# Patient Record
Sex: Female | Born: 1955 | Race: White | Hispanic: No | Marital: Married | Smoking: Never smoker
Health system: Southern US, Community
[De-identification: ages and names within clinical notes are randomized; demographics above are authoritative.]

## PROBLEM LIST (undated history)

## (undated) DIAGNOSIS — E611 Iron deficiency: Secondary | ICD-10-CM

## (undated) DIAGNOSIS — I10 Essential (primary) hypertension: Secondary | ICD-10-CM

## (undated) DIAGNOSIS — N951 Menopausal and female climacteric states: Secondary | ICD-10-CM

## (undated) DIAGNOSIS — N952 Postmenopausal atrophic vaginitis: Secondary | ICD-10-CM

## (undated) DIAGNOSIS — Z01419 Encounter for gynecological examination (general) (routine) without abnormal findings: Secondary | ICD-10-CM

## (undated) DIAGNOSIS — D649 Anemia, unspecified: Secondary | ICD-10-CM

## (undated) DIAGNOSIS — K635 Polyp of colon: Secondary | ICD-10-CM

## (undated) DIAGNOSIS — Z9289 Personal history of other medical treatment: Secondary | ICD-10-CM

## (undated) DIAGNOSIS — E785 Hyperlipidemia, unspecified: Secondary | ICD-10-CM

## (undated) DIAGNOSIS — C4491 Basal cell carcinoma of skin, unspecified: Secondary | ICD-10-CM

## (undated) HISTORY — PX: OTHER SURGICAL HISTORY: SHX169

## (undated) HISTORY — DX: Postmenopausal atrophic vaginitis: N95.2

## (undated) HISTORY — DX: Menopausal and female climacteric states: N95.1

## (undated) HISTORY — PX: BREAST SURGERY: SHX581

## (undated) HISTORY — DX: Polyp of colon: K63.5

## (undated) HISTORY — DX: Encounter for gynecological examination (general) (routine) without abnormal findings: Z01.419

## (undated) HISTORY — DX: Essential (primary) hypertension: I10

## (undated) HISTORY — DX: Iron deficiency: E61.1

## (undated) HISTORY — DX: Hyperlipidemia, unspecified: E78.5

## (undated) HISTORY — DX: Basal cell carcinoma of skin, unspecified: C44.91

## (undated) HISTORY — DX: Personal history of other medical treatment: Z92.89

## (undated) HISTORY — DX: Anemia, unspecified: D64.9

---

## 2000-03-30 ENCOUNTER — Other Ambulatory Visit: Admission: RE | Admit: 2000-03-30 | Discharge: 2000-03-30 | Payer: Self-pay | Admitting: Gynecology

## 2000-04-02 ENCOUNTER — Other Ambulatory Visit: Admission: RE | Admit: 2000-04-02 | Discharge: 2000-04-02 | Payer: Self-pay | Admitting: Gynecology

## 2001-04-02 ENCOUNTER — Other Ambulatory Visit: Admission: RE | Admit: 2001-04-02 | Discharge: 2001-04-02 | Payer: Self-pay | Admitting: Internal Medicine

## 2002-06-13 ENCOUNTER — Other Ambulatory Visit: Admission: RE | Admit: 2002-06-13 | Discharge: 2002-06-13 | Payer: Self-pay | Admitting: Gynecology

## 2003-07-03 ENCOUNTER — Other Ambulatory Visit: Admission: RE | Admit: 2003-07-03 | Discharge: 2003-07-03 | Payer: Self-pay | Admitting: Gynecology

## 2003-08-20 ENCOUNTER — Encounter: Admission: RE | Admit: 2003-08-20 | Discharge: 2003-11-18 | Payer: Self-pay | Admitting: Gynecology

## 2004-02-29 ENCOUNTER — Other Ambulatory Visit: Admission: RE | Admit: 2004-02-29 | Discharge: 2004-02-29 | Payer: Self-pay | Admitting: Gynecology

## 2004-07-08 ENCOUNTER — Other Ambulatory Visit: Admission: RE | Admit: 2004-07-08 | Discharge: 2004-07-08 | Payer: Self-pay | Admitting: Gynecology

## 2005-09-18 ENCOUNTER — Other Ambulatory Visit: Admission: RE | Admit: 2005-09-18 | Discharge: 2005-09-18 | Payer: Self-pay | Admitting: Gynecology

## 2005-12-04 ENCOUNTER — Encounter: Admission: RE | Admit: 2005-12-04 | Discharge: 2006-03-04 | Payer: Self-pay | Admitting: Gynecology

## 2006-09-20 ENCOUNTER — Other Ambulatory Visit: Admission: RE | Admit: 2006-09-20 | Discharge: 2006-09-20 | Payer: Self-pay | Admitting: Gynecology

## 2006-09-27 ENCOUNTER — Encounter: Payer: Self-pay | Admitting: Internal Medicine

## 2006-10-10 ENCOUNTER — Ambulatory Visit: Payer: Self-pay | Admitting: Internal Medicine

## 2007-10-02 ENCOUNTER — Other Ambulatory Visit: Admission: RE | Admit: 2007-10-02 | Discharge: 2007-10-02 | Payer: Self-pay | Admitting: Gynecology

## 2008-10-02 ENCOUNTER — Encounter: Payer: Self-pay | Admitting: Gynecology

## 2008-10-02 ENCOUNTER — Other Ambulatory Visit: Admission: RE | Admit: 2008-10-02 | Discharge: 2008-10-02 | Payer: Self-pay | Admitting: Gynecology

## 2008-10-02 ENCOUNTER — Ambulatory Visit: Payer: Self-pay | Admitting: Gynecology

## 2008-11-05 ENCOUNTER — Ambulatory Visit: Payer: Self-pay | Admitting: Gynecology

## 2009-06-20 DIAGNOSIS — E611 Iron deficiency: Secondary | ICD-10-CM

## 2009-06-20 HISTORY — DX: Iron deficiency: E61.1

## 2009-08-20 DIAGNOSIS — K635 Polyp of colon: Secondary | ICD-10-CM

## 2009-08-20 HISTORY — DX: Polyp of colon: K63.5

## 2009-10-04 ENCOUNTER — Ambulatory Visit: Payer: Self-pay | Admitting: Gynecology

## 2009-10-04 ENCOUNTER — Other Ambulatory Visit: Admission: RE | Admit: 2009-10-04 | Discharge: 2009-10-04 | Payer: Self-pay | Admitting: Gynecology

## 2009-10-05 ENCOUNTER — Ambulatory Visit: Payer: Self-pay | Admitting: Gynecology

## 2009-12-16 ENCOUNTER — Ambulatory Visit: Payer: Self-pay | Admitting: Gynecology

## 2010-03-28 ENCOUNTER — Ambulatory Visit (INDEPENDENT_AMBULATORY_CARE_PROVIDER_SITE_OTHER): Payer: BC Managed Care – PPO | Admitting: Physician Assistant

## 2010-03-28 DIAGNOSIS — J020 Streptococcal pharyngitis: Secondary | ICD-10-CM

## 2010-08-03 ENCOUNTER — Encounter: Payer: Self-pay | Admitting: Medical

## 2010-08-04 ENCOUNTER — Encounter: Payer: Self-pay | Admitting: Medical

## 2010-08-04 ENCOUNTER — Ambulatory Visit (INDEPENDENT_AMBULATORY_CARE_PROVIDER_SITE_OTHER): Payer: BC Managed Care – PPO | Admitting: Medical

## 2010-08-04 VITALS — BP 112/78 | HR 60 | Ht 68.0 in | Wt 146.0 lb

## 2010-08-04 DIAGNOSIS — E611 Iron deficiency: Secondary | ICD-10-CM

## 2010-08-04 DIAGNOSIS — E785 Hyperlipidemia, unspecified: Secondary | ICD-10-CM

## 2010-08-04 DIAGNOSIS — I10 Essential (primary) hypertension: Secondary | ICD-10-CM | POA: Insufficient documentation

## 2010-08-04 LAB — CBC WITH DIFFERENTIAL/PLATELET
Basophils Absolute: 0 10*3/uL (ref 0.0–0.1)
Basophils Relative: 0 % (ref 0–1)
Eosinophils Absolute: 0.1 10*3/uL (ref 0.0–0.7)
Eosinophils Relative: 2 % (ref 0–5)
HCT: 38.7 % (ref 36.0–46.0)
Hemoglobin: 12.4 g/dL (ref 12.0–15.0)
Lymphocytes Relative: 23 % (ref 12–46)
Lymphs Abs: 1.4 10*3/uL (ref 0.7–4.0)
MCH: 28.4 pg (ref 26.0–34.0)
MCHC: 32 g/dL (ref 30.0–36.0)
MCV: 88.6 fL (ref 78.0–100.0)
Monocytes Absolute: 0.7 10*3/uL (ref 0.1–1.0)
Monocytes Relative: 12 % (ref 3–12)
Neutro Abs: 3.7 10*3/uL (ref 1.7–7.7)
Neutrophils Relative %: 62 % (ref 43–77)
Platelets: 246 10*3/uL (ref 150–400)
RBC: 4.37 MIL/uL (ref 3.87–5.11)
RDW: 13.3 % (ref 11.5–15.5)
WBC: 5.9 10*3/uL (ref 4.0–10.5)

## 2010-08-04 LAB — FERRITIN: Ferritin: 15 ng/mL (ref 10–291)

## 2010-08-04 MED ORDER — MOEXIPRIL HCL 7.5 MG PO TABS: 7.5000 mg | ORAL_TABLET | Freq: Every day | ORAL | Status: DC

## 2010-08-04 MED ORDER — SIMVASTATIN 10 MG PO TABS: 10.0000 mg | ORAL_TABLET | Freq: Every day | ORAL | Status: DC

## 2010-08-04 NOTE — Progress Notes (Signed)
  Subjective:    Sherri Rangel is an 55 y.o. female who presents for follow-up of elevated blood pressure.  She is exercising and is adherent to a low-salt diet.  Blood pressure is well controlled at home. Cardiac symptoms: none. Patient denies: chest pain, dyspnea, fatigue, irregular heart beat and palpitations. Cardiovascular risk factors: dyslipidemia and hypertension. Use of agents associated with hypertension: none. History of target organ damage: none.   [name] is here for follow up of elevated cholesterol. Compliance with treatment has been excellent. The patient exercises daily. Patient denies muscle pain associated with her medications.  She is UTD on mammogram and pap smear, 7/11 through Dr. Fernandez/gynecology.  She has a colonoscopy last year due to iron deficiency.  No abnormality found on colonoscopy.  She brought prior records today for our review.   The following portions of the patient's history were reviewed and updated as appropriate: allergies, current medications, past family history, past medical history, past social history, past surgical history and problem list.  Past Medical History  Diagnosis Date  . Hypertension   . Hyperlipidemia   . BCC (basal cell carcinoma)     right eye  . Iron deficiency 5/11    Review of Systems Constitutional: denies recent fevers, chills, weight changes, anorexia Allergy: denies recent allergy problems, nasal congestion, sneezing Skin: denies foot lesions, rash, or other worrisome lesion ENT: denies cough, runny nose, sore throat, hoarseness Cardiology: denies chest pain, palpitations, edema, orthopnea, paroxysmal nocturnal dyspnea Respiratory: denies shortness of breath, dyspnea on exertion, wheezing Gastroenterology: no abdominal pain, nausea, vomiting, diarrhea, constipation, bowel change, blood in stool Musculoskeletal: denies arthralgias, myalgias, cramps Opthalmology: denies vision change, blurred vision, allergy  symptoms Urology: denies dysuria, frequency, urgency, blood in urine, stream changes Neurology: denies numbness, tingling, weakness, gait changes Psychology: denies depressed mood, changes in stress    Objective:   Filed Vitals:   08/04/10 0827  BP: 112/78  Pulse: 60    General Appearance:    Alert, no distress, white female   HEENT:    Normocephalic, without obvious abnormality, PERRLA, conjunctiva/corneas clear, EOM intact, fundi benign, TMs and external ears normal, nares patent, mucosa normal, pharynx normal appearing  Oral cavity:   Lips, mucosa, and tongue normal; teeth and gums normal  Neck:   Supple, no lymphadenopathy;  thyroid:  no    enlargement/tenderness/nodules; no carotid   bruit or JVD  Lungs:     Clear to auscultation bilaterally without wheezes, rales or       rhonchi; respirations unlabored   Heart:    Regular rate and rhythm, S1 and S2 normal, no murmur  Abdomen:     Soft, non-tender, non distended, normoactive bowel sounds,    no masses, no hepatosplenomegaly, no bruits  Extremities:   No clubbing, cyanosis or edema  Pulses:   2+ and symmetric all extremities  Skin:   Warm, dry, normal turgor, no foot lesions          Psych:   Normal mood, affect, hygiene and grooming.      Assessment:   Encounter Diagnoses  Name Primary?  . Essential hypertension, benign Yes  . Hyperlipidemia   . Iron deficiency       Plan:   HTN - well controlled on current medication.  Hyperlipidemia - c/t same meds, labs today fasting.  Iron deficiency - labs today to recheck.  C/t regular exercise, healthy diet, and call/return if problems.

## 2010-08-05 LAB — LIPID PANEL
Cholesterol: 162 mg/dL (ref 0–200)
Triglycerides: 53 mg/dL (ref <150)
VLDL: 11 mg/dL (ref 0–40)

## 2010-08-05 LAB — COMPREHENSIVE METABOLIC PANEL
CO2: 24 mEq/L (ref 19–32)
Calcium: 9.3 mg/dL (ref 8.4–10.5)
Chloride: 103 mEq/L (ref 96–112)
Glucose, Bld: 81 mg/dL (ref 70–99)
Sodium: 140 mEq/L (ref 135–145)
Total Bilirubin: 0.6 mg/dL (ref 0.3–1.2)
Total Protein: 6.8 g/dL (ref 6.0–8.3)

## 2010-08-05 LAB — IRON AND TIBC: Iron: 92 ug/dL (ref 42–145)

## 2010-08-10 ENCOUNTER — Telehealth: Payer: Self-pay | Admitting: *Deleted

## 2010-08-10 NOTE — Telephone Encounter (Addendum)
Message copied by Dorthula Perfect on Wed Aug 10, 2010 10:25 AM ------      Message from: Jac Canavan      Created: Tue Aug 09, 2010  9:04 PM       Labs all look fine - liver, kidney, lytes, iron, ferritin, blood counts, and cholesterol.  Ask if she is still taking iron, either prescription or OTC?  I know she had a colonoscopy, but did she ever have an upper endoscopy?    Notified pt of lab results.  Asked pt regarding OTC or prescription iron and pt stated "I only take a multivitamin that has iron in it but nothing else."  Asked pt regarding endoscopy and pt stated she did have that done also.  CM,LPN

## 2010-08-10 NOTE — Telephone Encounter (Signed)
Ok.  Then lets have her c/t same meds, regular exercise and healthy diet, return in the fall for flu shot.  Recheck in 6 mo.

## 2010-08-10 NOTE — Telephone Encounter (Signed)
Pt notified  To cont. Same meds with regular exercise and a healthy diet.  Scheduled a 6 month follow up on 01-09-2011 at 8:30 am.  CM, LPN

## 2010-08-17 ENCOUNTER — Ambulatory Visit (INDEPENDENT_AMBULATORY_CARE_PROVIDER_SITE_OTHER): Payer: BC Managed Care – PPO | Admitting: Family Medicine

## 2010-08-17 ENCOUNTER — Encounter: Payer: Self-pay | Admitting: Family Medicine

## 2010-08-17 VITALS — BP 110/74 | HR 80 | Temp 98.4°F | Ht 68.0 in | Wt 149.0 lb

## 2010-08-17 DIAGNOSIS — IMO0002 Reserved for concepts with insufficient information to code with codable children: Secondary | ICD-10-CM

## 2010-08-17 DIAGNOSIS — J309 Allergic rhinitis, unspecified: Secondary | ICD-10-CM

## 2010-08-17 MED ORDER — CEPHALEXIN 500 MG PO CAPS: 500.0000 mg | ORAL_CAPSULE | Freq: Three times a day (TID) | ORAL | Status: AC

## 2010-08-17 NOTE — Progress Notes (Signed)
SUBJECTIVE: Patient presents with complaint of infection in R index finger since the beginning of June.  Last week she could see a white spot--she poked it with a needle, and it drained pus.  Since then it is improved--no longer hot to the touch, less tender, smaller in size.  Presents today because it still just doesn't feel right.  Similar problem 4 years ago after a manicure.  She leaves Monday to go to the beach for 2 weeks, so presents for evaluation today.  Has some allergies flaring related to packing up books at school and exposure to dust.  Nasal congestion has gotten somewhat better, but now feels like it is in her chest. Some coughing, doesn't look at the phlegm that comes up.  Using Nyquil at night if coughing a lot at bedtime.  Past Medical History  Diagnosis Date  . Hypertension   . Hyperlipidemia   . Iron deficiency 06/2009    negative EGD and colonoscopy 08/2009  . BCC (basal cell carcinoma)     right eye, LLE  . Colon polyp 08/2009    WNL, internal hemorrhoid (Dr. Kinnie Scales); also had colonoscopy 2008 with Dr. Leone Payor    Past Surgical History  Procedure Date  . Skin tags   . Colonoscopy 2011    History   Social History  . Marital Status: Married    Spouse Name: N/A    Number of Children: N/A  . Years of Education: N/A   Occupational History  . music teacher at [employer]   Social History Main Topics  . Smoking status: Never Smoker   . Smokeless tobacco: Never Used  . Alcohol Use: 0.5 oz/week    1 drink(s) per week     4 drinks in a month  . Drug Use: No  . Sexually Active: Not on file     exercise - step aerobics, some weights.  Teaches elementary music.  Married.   Other Topics Concern  . Not on file   Social History Narrative  . No narrative on file    Family History  Problem Relation Age of Onset  . Cancer Mother 62    ovarian cancer  . Hypertension Mother   . Cancer Father     esophageal  . Alcohol abuse Father    . Hyperlipidemia Father   . Diabetes Maternal Aunt     Current outpatient prescriptions:Calcium Carbonate-Vitamin D (CALCIUM 600+D) 600-400 MG-UNIT per tablet, Take 1 tablet by mouth daily.  , Disp: , Rfl: ;  Krill Oil 300 MG CAPS, Take 1 capsule by mouth daily.  , Disp: , Rfl: ;  moexipril (UNIVASC) 7.5 MG tablet, Take 1 tablet (7.5 mg total) by mouth at bedtime., Disp: 90 tablet, Rfl: 3;  Multiple Vitamin (MULTIVITAMIN) tablet, Take 1 tablet by mouth daily.  , Disp: , Rfl:  simvastatin (ZOCOR) 10 MG tablet, Take 1 tablet (10 mg total) by mouth at bedtime., Disp: 90 tablet, Rfl: 3;  DISCONTD: Omega-3 Fatty Acids (OMEGA-3 FISH OIL PO), Take 1 capsule by mouth daily.  , Disp: , Rfl: ;  cephALEXin (KEFLEX) 500 MG capsule, Take 1 capsule (500 mg total) by mouth 3 (three) times daily., Disp: 30 capsule, Rfl: 0;  DISCONTD: calcium carbonate 200 MG capsule, Take 250 mg by mouth daily.  , Disp: , Rfl:   No Known Allergies  ROS:  Denies fevers, shortness of breath.  +URI symptoms--see HPI.  Denies rash, GI complaints or other concerns  PHYSICAL EXAM: BP  110/74  Pulse 80  Temp(Src) 98.4 F (36.9 C) (Oral)  Ht 5\' 8"  (1.727 m)  Wt 149 lb (67.586 kg)  BMI 22.66 kg/m2 Pleasant female, frequently clearing throat and coughing, in no distress HEENT:  PERRL, EOMI, conjunctiva clear.  TM's and EAC's normal.  Nasal mucosa mildly edematous with white drainage.  OP with some cobblestoning posteriorly.  Sinuses nontender Neck: no lymphadenopathy Heart:  Regular rate and rhythm without murmurs Lungs:  Clear bilaterally Extremities: no clubbing, cyanosis or edema.  Right 2nd finger, radial aspect of nail shows minimal inflammation, and an area where it recently drained.  No warmth, tenderness, just slightly swollen compared to other fingers.  No streaking, no drainage  ASSESSMENT/PLAN: 1. Paronychia  cephALEXin (KEFLEX) 500 MG capsule   R index finger  2. Allergic rhinitis, cause unspecified     Paronychia,  successfully treated at home with drainage. No evidence of continuing infection, but since patient is going out of town for the next 2 weeks, will give a rx for Keflex.  She will continue to do soaks 2-3 times/day.  If she develops increasing redness, warmth or pain, then will start (and complete) the antibiotics.  Allergies and chest congestion:  Recommended Claritin and mucinex.  F/u if symptoms persist/worsen

## 2010-08-17 NOTE — Patient Instructions (Addendum)
PARONYCHIA--continue to do soaks 2-3 times/day.  If you develop increasing redness, warmth or pain, then will start (and complete) the antibiotics.    Paronychia (Felon, Whitlow) Paronychia is an inflammatory reaction involving the folds of the skin surrounding the fingernail. This is commonly caused by an infection in the skin around a nail. The most common cause of paronychia is frequent wetting of the hands (as seen with bartenders, food servers, nurses or others who wet their hands). This makes the skin around the fingernail susceptible to infection by bacteria (germs) or fungus. Other predisposing factors are:  Aggressive manicuring.   Nail biting.   Thumbsucking.  The most common cause is a staphylococcal (a type of germ) infection, or a fungal (Candida) infection. When caused by a germ, it usually comes on suddenly with redness, swelling, pus and is often painful. It may get under the nail and form an abscess (collection of pus), or form an abscess around the nail. If the nail itself is infected with a fungus, the treatment is usually prolonged and may require oral medicine for up to one year. Your caregiver will determine the length of time treatment is required. The paronychia caused by bacteria (germs) may largely be avoided by not pulling on hangnails or picking at cuticles. When the infection occurs at the tips of the finger it is called felon. When the cause of paronychia is from the herpes simplex virus (HSV) it is called herpetic whitlow. TREATMENT  When an abscess is present treatment is often incision and drainage. This means that the abscess must be cut open so the pus can get out. When this is done, the following home care instructions should be followed.  HOME CARE INSTRUCTIONS  It is important to keep the affected fingers very dry. Rubber or plastic gloves over cotton gloves should be used whenever the hand must be placed in water.   Keep wound clean, dry and dressed as  suggested by your caregiver between warm soaks or warm compresses.   Soak in warm water for fifteen to twenty minutes three to four times per day for bacterial infections. Fungal infections are very difficult to treat, so often require treatment for long periods of time.   For bacterial (germ) infections take antibiotics (medicine which kill germs) as directed and finish the prescription, even if the problem appears to be solved before the medicine is gone.   Only take over-the-counter or prescription medicines for pain, discomfort, or fever as directed by your caregiver.  SEEK IMMEDIATE MEDICAL CARE IF :  There is redness, swelling, or increasing pain in the wound.   Pus is coming from wound.   An unexplained oral temperature above 101 develops.   You notice a foul smell coming from the wound or dressing.  Document Released: 08/02/2000 Document Re-Released: 05/05/2008 [facility] Patient Information 2011 [place], [place].   CLARITIN AND MUCINEX MAY HELP WITH YOUR ALLERGY SYMPTOMS AND CHEST CONGESTION

## 2010-09-15 ENCOUNTER — Encounter: Payer: Self-pay | Admitting: Gynecology

## 2010-10-10 ENCOUNTER — Encounter: Payer: Self-pay | Admitting: Gynecology

## 2010-10-10 ENCOUNTER — Other Ambulatory Visit (HOSPITAL_COMMUNITY)
Admission: RE | Admit: 2010-10-10 | Discharge: 2010-10-10 | Disposition: A | Discharge status: Home / Self Care | Payer: BC Managed Care – PPO | Source: Ambulatory Visit | Attending: Gynecology | Admitting: Gynecology

## 2010-10-10 ENCOUNTER — Ambulatory Visit (INDEPENDENT_AMBULATORY_CARE_PROVIDER_SITE_OTHER): Payer: BC Managed Care – PPO | Admitting: Gynecology

## 2010-10-10 VITALS — BP 130/82 | Ht 67.75 in | Wt 152.0 lb

## 2010-10-10 DIAGNOSIS — Z1211 Encounter for screening for malignant neoplasm of colon: Secondary | ICD-10-CM

## 2010-10-10 DIAGNOSIS — Z01419 Encounter for gynecological examination (general) (routine) without abnormal findings: Secondary | ICD-10-CM

## 2010-10-10 LAB — HEMOCCULT GUIAC POC 1CARD (OFFICE)

## 2010-10-10 NOTE — Progress Notes (Signed)
Sherri Rangel 07-Jan-1956 [id number]   History:    55 y.o.  for annual exam is a gravida 3 para 3 who was asymptomatic today. Her primary physician has now her lab work in the spring of this year and they have been monitor her for her hypercholesterolemia and hypertension. She was normotensive today. She was worked up recently for anemia with an upper endoscopy as well as colonoscopy in 2011 no abnormal findings although she did have benign colonic polyps in 2008. Her mammogram was done in July 2012 was normal she does her monthly self breast examinations. Her last bone density study was here in our office in 2011 with normal bone mineralization indices.  Past medical history,surgical history, family history and social history were all reviewed and documented in the EPIC chart. ROS:  Was performed and pertinent positives and negatives are included in the history.  Exam: chaperone present Filed Vitals:   10/10/10 0852  BP: 130/82   Body mass index is 23.28 kg/(m^2).  General appearance : Well developed well nourished female. Skin grossly normal HEENT: Neck supple, trachea midline Lungs: Clear to auscultation, no rhonchi or wheezes Heart: Regular rate and rhythm, no murmurs or gallops Breast:Examined in sitting and supine position were symmetrical in appearance, no palpable masses, to skin retraction, no nipple inversion, no nipple discharge and no axillary or supraclavicular lymphadenopathy Abdomen: no palpable masses or tenderness Pelvic  Ext/BUS/vagina  normal   Cervix  normal   Uterus  anteverted, normal size, shape and contour, midline and mobile nontender   Adnexa  Without masses or tenderness  Anus and perineum  normal   Rectovaginal  normal sphincter tone without palpated masses or tenderness             Hemoccult done result pending at time of this dictation     Assessment/Plan:  55 y.o. female for annual exam asymptomatic with normal annual gynecological examination. Blood  work drawn by her primary physician spring of this year with normal labs. There followed her for hypertension and hypercholesterolemia. Only Pap smear was done today. She was instructed to continue calcium and vitamin D for osteoporosis prevention as well as a regular exercise program consisting of 35-45 minutes 3 times a week of weightbearing exercises. She is also to continue her calcium and vitamin D twice a day as well. Next year she will be scheduled for her bone density study.    Ok Edwards MD, 9:29 AM 10/10/2010

## 2010-10-26 ENCOUNTER — Telehealth: Payer: Self-pay | Admitting: Medical

## 2010-10-26 ENCOUNTER — Other Ambulatory Visit: Payer: Self-pay | Admitting: Medical

## 2010-10-26 MED ORDER — LISINOPRIL 5 MG PO TABS: 5.0000 mg | ORAL_TABLET | Freq: Every day | ORAL | Status: DC

## 2010-10-26 NOTE — Telephone Encounter (Signed)
DONE

## 2010-10-27 ENCOUNTER — Telehealth: Payer: Self-pay | Admitting: Family Medicine

## 2010-10-27 NOTE — Telephone Encounter (Signed)
Moexipril hcl 7.5 mg  1 po qd

## 2010-10-27 NOTE — Telephone Encounter (Signed)
This med was changed yesterday (to lisinopril, due to cost)

## 2010-11-28 ENCOUNTER — Ambulatory Visit (INDEPENDENT_AMBULATORY_CARE_PROVIDER_SITE_OTHER): Payer: BC Managed Care – PPO | Admitting: Medical

## 2010-11-28 ENCOUNTER — Encounter: Payer: Self-pay | Admitting: Medical

## 2010-11-28 VITALS — BP 120/80 | HR 64 | Temp 98.5°F | Wt 150.0 lb

## 2010-11-28 DIAGNOSIS — I10 Essential (primary) hypertension: Secondary | ICD-10-CM

## 2010-11-28 NOTE — Progress Notes (Signed)
Subjective:    Patient here for follow-up of elevated blood pressure. She called a month ago as insurer would no longer pay for prior BP medication, Moexipril.  Since we changed her to Lisinopril 5mg , she has done well with normal BP readings at home.  She is exercising and is adherent to a low-salt diet.  Blood pressure is well controlled at home. Cardiac symptoms: none. Patient denies: chest pain, dyspnea and palpitations. Cardiovascular risk factors: dyslipidemia and hypertension. Use of agents associated with hypertension: none. History of target organ damage: none.  The following portions of the patient's history were reviewed and updated as appropriate: allergies, current medications, past family history, past medical history, past social history, past surgical history and problem list.  Past Medical History  Diagnosis Date  . Hyperlipidemia   . Iron deficiency 06/2009    negative EGD and colonoscopy 08/2009  . BCC (basal cell carcinoma)     right eye, LLE  . Colon polyp 08/2009    WNL, internal hemorrhoid (Dr. Kinnie Scales); also had colonoscopy 2008 with Dr. Leone Payor  . Hypertension     Review of Systems Constitutional: denies recent fevers, chills, weight changes, anorexia Cardiology: denies chest pain, palpitations, edema, orthopnea, paroxysmal nocturnal dyspnea Respiratory: denies shortness of breath, dyspnea on exertion, wheezing Gastroenterology: no abdominal pain, nausea, vomiting, diarrhea, constipation Neurology: denies numbness, tingling, weakness, gait changes    Objective:   Filed Vitals:   11/28/10 1542  BP: 120/80  Pulse: 64  Temp: 98.5 F (36.9 C)    General Appearance:    Alert, no distress  Neck:   Supple, no lymphadenopathy;  thyroid:  no    enlargement/tenderness/nodules; no carotid   bruit or JVD  Lungs:     Clear to auscultation bilaterally without wheezes, rales or       rhonchi; respirations unlabored   Heart:    Regular rate and rhythm, S1 and S2 normal,  no murmur  Extremities:   No clubbing, cyanosis or edema  Pulses:   2+ and symmetric all extremities       Assessment:   Encounter Diagnosis  Name Primary?  . Essential hypertension, benign Yes    Plan:   Hypertension - well controlled on Lisinopril 5mg  daily.  Reviewed her most recent labs in June.  At this point, advised she check [facility] or [facility] since her medication costs $12 per month and she wants a little cheaper price.  C/t regular exercise, low salt diet, recheck in 27mo.

## 2011-01-09 ENCOUNTER — Ambulatory Visit (INDEPENDENT_AMBULATORY_CARE_PROVIDER_SITE_OTHER): Payer: BC Managed Care – PPO | Admitting: Medical

## 2011-01-09 ENCOUNTER — Encounter: Payer: Self-pay | Admitting: Medical

## 2011-01-09 VITALS — BP 120/78 | HR 64 | Temp 98.4°F | Resp 16 | Wt 149.0 lb

## 2011-01-09 DIAGNOSIS — I10 Essential (primary) hypertension: Secondary | ICD-10-CM

## 2011-01-09 DIAGNOSIS — E785 Hyperlipidemia, unspecified: Secondary | ICD-10-CM

## 2011-01-09 LAB — LIPID PANEL
Cholesterol: 166 mg/dL (ref 0–200)
HDL: 59 mg/dL (ref >39)
Total CHOL/HDL Ratio: 2.8 Ratio
VLDL: 12 mg/dL (ref 0–40)

## 2011-01-09 LAB — ALT: ALT: 16 U/L (ref 0–35)

## 2011-01-09 NOTE — Progress Notes (Signed)
Subjective:   HPI  Sherri Rangel is a 55 y.o. female who presents for routine f/u on lipids and blood pressure.  No medication side effects.  Been doing well.   Exercises regularly with video aerobics with friends, eats healthy except she likes candy.  She is up to date on mammogram, pap with gynecology and up to date on colonoscopy.  No other c/o.  The following portions of the patient's history were reviewed and updated as appropriate: allergies, current medications, past family history, past medical history, past social history, past surgical history and problem list.  Past Medical History  Diagnosis Date  . Hyperlipidemia   . Iron deficiency 06/2009    negative EGD and colonoscopy 08/2009  . BCC (basal cell carcinoma)     right eye, LLE  . Colon polyp 08/2009    WNL, internal hemorrhoid (Dr. Kinnie Idan Prime); also had colonoscopy 2008 with Dr. Leone Payor  . Hypertension    Review of Systems Constitutional: -fever, -chills, -sweats, -unexpected -weight change,-fatigue ENT: -runny nose, -ear pain, -sore throat Cardiology:  -chest pain, -palpitations, -edema Respiratory: -cough, -shortness of breath, -wheezing Gastroenterology: -abdominal pain, -nausea, -vomiting, -diarrhea, -constipation Hematology: -bleeding or bruising problems Musculoskeletal: -arthralgias, -myalgias, -joint swelling, -back pain Ophthalmology: -vision changes Urology: -dysuria, -difficulty urinating, -hematuria, -urinary frequency, -urgency Neurology: -headache, -weakness, -tingling, -numbness      Objective:   Physical Exam  Filed Vitals:   01/09/11 0837  BP: 120/78  Pulse: 64  Temp: 98.4 F (36.9 C)  Resp: 16    General appearance: alert, no distress, WD/WN, lean white female Heart: RRR, normal S1, S2, no murmurs Lungs: CTA bilaterally, no wheezes, rhonchi, or rales Abdomen: +bs, soft, non tender, non distended, no masses, no hepatomegaly, no splenomegaly Pulses: 2+ symmetric, upper and lower extremities,  normal cap refill   Assessment and Plan :    Encounter Diagnoses  Name Primary?  . Hyperlipidemia Yes  . Essential hypertension, benign    Hyperlipidemia - labs today  HTN - well controlled.  Follow-up 26mo

## 2011-02-15 ENCOUNTER — Ambulatory Visit (INDEPENDENT_AMBULATORY_CARE_PROVIDER_SITE_OTHER): Payer: BC Managed Care – PPO

## 2011-02-15 DIAGNOSIS — R05 Cough: Secondary | ICD-10-CM

## 2011-02-15 DIAGNOSIS — J019 Acute sinusitis, unspecified: Secondary | ICD-10-CM

## 2011-07-29 ENCOUNTER — Other Ambulatory Visit: Payer: Self-pay | Admitting: Medical

## 2011-08-11 ENCOUNTER — Telehealth: Payer: Self-pay | Admitting: *Deleted

## 2011-08-11 DIAGNOSIS — Z8041 Family history of malignant neoplasm of ovary: Secondary | ICD-10-CM

## 2011-08-11 NOTE — Telephone Encounter (Signed)
(  pt aware you are out of the office) Pt has annual scheduled on 10/11/11, she would like to know if you want ultrasound done this year as well? Her mother died of ovarian cancer, last ultrasound done in 09/2009, which was normal per office note. Please advise

## 2011-08-13 NOTE — Telephone Encounter (Signed)
Patient to have ultrasound same day as annual due to family history of ovarian cancer.

## 2011-08-14 NOTE — Telephone Encounter (Signed)
Order placed in computer, pt will informed.

## 2011-08-21 ENCOUNTER — Encounter: Payer: Self-pay | Admitting: Internal Medicine

## 2011-09-18 LAB — HM MAMMOGRAPHY: HM Mammogram: NEGATIVE

## 2011-09-19 ENCOUNTER — Encounter: Payer: Self-pay | Admitting: Internal Medicine

## 2011-09-22 ENCOUNTER — Encounter: Payer: Self-pay | Admitting: Gynecology

## 2011-10-11 ENCOUNTER — Ambulatory Visit (INDEPENDENT_AMBULATORY_CARE_PROVIDER_SITE_OTHER): Payer: BC Managed Care – PPO | Admitting: Gynecology

## 2011-10-11 ENCOUNTER — Encounter: Payer: Self-pay | Admitting: Gynecology

## 2011-10-11 ENCOUNTER — Other Ambulatory Visit (HOSPITAL_COMMUNITY)
Admission: RE | Admit: 2011-10-11 | Discharge: 2011-10-11 | Disposition: A | Discharge status: Home / Self Care | Payer: BC Managed Care – PPO | Source: Ambulatory Visit | Attending: Gynecology | Admitting: Gynecology

## 2011-10-11 ENCOUNTER — Ambulatory Visit (INDEPENDENT_AMBULATORY_CARE_PROVIDER_SITE_OTHER): Payer: BC Managed Care – PPO

## 2011-10-11 VITALS — BP 116/80 | Ht 68.0 in | Wt 150.0 lb

## 2011-10-11 DIAGNOSIS — Z01419 Encounter for gynecological examination (general) (routine) without abnormal findings: Secondary | ICD-10-CM | POA: Insufficient documentation

## 2011-10-11 DIAGNOSIS — Z8041 Family history of malignant neoplasm of ovary: Secondary | ICD-10-CM

## 2011-10-11 DIAGNOSIS — N83339 Acquired atrophy of ovary and fallopian tube, unspecified side: Secondary | ICD-10-CM

## 2011-10-11 DIAGNOSIS — N952 Postmenopausal atrophic vaginitis: Secondary | ICD-10-CM

## 2011-10-11 DIAGNOSIS — Z1151 Encounter for screening for human papillomavirus (HPV): Secondary | ICD-10-CM | POA: Insufficient documentation

## 2011-10-11 DIAGNOSIS — D649 Anemia, unspecified: Secondary | ICD-10-CM | POA: Insufficient documentation

## 2011-10-11 DIAGNOSIS — Z780 Asymptomatic menopausal state: Secondary | ICD-10-CM

## 2011-10-11 LAB — CBC WITH DIFFERENTIAL/PLATELET
Basophils Absolute: 0 10*3/uL (ref 0.0–0.1)
HCT: 38.1 % (ref 36.0–46.0)
Hemoglobin: 12.6 g/dL (ref 12.0–15.0)
Lymphocytes Relative: 28 % (ref 12–46)
Monocytes Absolute: 0.4 10*3/uL (ref 0.1–1.0)
Monocytes Relative: 6 % (ref 3–12)
Neutro Abs: 3.8 10*3/uL (ref 1.7–7.7)
RBC: 4.41 MIL/uL (ref 3.87–5.11)
RDW: 13.6 % (ref 11.5–15.5)
WBC: 5.9 10*3/uL (ref 4.0–10.5)

## 2011-10-11 LAB — COMPREHENSIVE METABOLIC PANEL
ALT: 16 U/L (ref 0–35)
AST: 22 U/L (ref 0–37)
Albumin: 4.3 g/dL (ref 3.5–5.2)
Calcium: 9.5 mg/dL (ref 8.4–10.5)
Chloride: 105 mEq/L (ref 96–112)
Potassium: 4.5 mEq/L (ref 3.5–5.3)

## 2011-10-11 MED ORDER — ESTRADIOL 10 MCG VA TABS: 1.0000 | ORAL_TABLET | VAGINAL | Status: DC

## 2011-10-11 NOTE — Progress Notes (Signed)
Sherri Rangel Sep 14, 1955 [id number]   History:    56 y.o.  for annual gyn exam who is menopausal with no vasomotor symptoms reported. She has history of hypertension hypercholesterolemia and is being followed by Dr.LaLonde who she has not seen him in over year. Please see medicine list for details. Patient with history of colon polyp in 2008 and followup colonoscopy in 2011 was negative. Dr. Loreta Ave offered seen in the past for anemia. Her last bone density study was normal in 2011. Her last Pap smear was in August 2012 which was negative. Several years ago she did have history of a right breast biopsy which was negative. Her last mammogram was in July of this year which was negative. Patient been complaining of vaginal dryness and irritation. Her mother had ovarian cancer and for this reason every year we do an ultrasound along with a CA 125. Today's ultrasound results as follows:  Uterus measures 6.8 x 4.2 x 3.1 cm with endometrial stripe of 1.5 mm right and left ovary were normal with no apparent adnexal masses. Past medical history,surgical history, family history and social history were all reviewed and documented in the EPIC chart.  Gynecologic History No LMP recorded. Patient is postmenopausal. Contraception: none and Menopausal Last Pap: 2012. Results were: normal Last mammogram: 2013. Results were: normal  Obstetric History OB History    Grav Para Term Preterm Abortions TAB SAB Ect Mult Living   3 3        3      # Outc Date GA Lbr Len/2nd Wgt Sex Del Anes PTL Lv   1 PAR            2 PAR            3 PAR                ROS: A ROS was performed and pertinent positives and negatives are included in the history.  GENERAL: No fevers or chills. HEENT: No change in vision, no earache, sore throat or sinus congestion. NECK: No pain or stiffness. CARDIOVASCULAR: No chest pain or pressure. No palpitations. PULMONARY: No shortness of breath, cough or wheeze. GASTROINTESTINAL: No abdominal  pain, nausea, vomiting or diarrhea, melena or bright red blood per rectum. GENITOURINARY: No urinary frequency, urgency, hesitancy or dysuria. MUSCULOSKELETAL: No joint or muscle pain, no back pain, no recent trauma. DERMATOLOGIC: No rash, no itching, no lesions. ENDOCRINE: No polyuria, polydipsia, no heat or cold intolerance. No recent change in weight. HEMATOLOGICAL: No anemia or easy bruising or bleeding. NEUROLOGIC: No headache, seizures, numbness, tingling or weakness. PSYCHIATRIC: No depression, no loss of interest in normal activity or change in sleep pattern.     Exam: chaperone present  BP 116/80  Ht 5\' 8"  (1.727 m)  Wt 150 lb (68.04 kg)  BMI 22.81 kg/m2  Body mass index is 22.81 kg/(m^2).  General appearance : Well developed well nourished female. No acute distress HEENT: Neck supple, trachea midline, no carotid bruits, no thyroidmegaly Lungs: Clear to auscultation, no rhonchi or wheezes, or rib retractions  Heart: Regular rate and rhythm, no murmurs or gallops Breast:Examined in sitting and supine position were symmetrical in appearance, no palpable masses or tenderness,  no skin retraction, no nipple inversion, no nipple discharge, no skin discoloration, no axillary or supraclavicular lymphadenopathy Abdomen: no palpable masses or tenderness, no rebound or guarding Extremities: no edema or skin discoloration or tenderness  Pelvic:  Bartholin, Urethra, Skene Glands: Within normal limits  Vagina: No gross lesions or discharge  Cervix: No gross lesions or discharge  Uterus  anteverted, normal size, shape and consistency, non-tender and mobile  Adnexa  Without masses or tenderness  Anus and perineum  normal   Rectovaginal  normal sphincter tone without palpated masses or tenderness             Hemoccult cards provided for her to submit to the office for testing.     Assessment/Plan:  56 y.o. female for annual exam with complaints of vaginal dryness and irritation  and atrophy. She'll be placed on Vagifem 10 mcg to apply intravaginally twice a week. We did discuss the risks benefits and pros and cons of hormone replacement therapy as well as the women's health initiative study. Due to patient's mother with history of ovarian cancer yearly ultrasound and CA 125 is being done. The following labs will be drawn today: Fasting lipid profile, conference metabolic panel, TSH, CBC, urinalysis, and Pap smear. New Pap smear screening guidelines were discussed. She was reminded to continue to do her monthly self breast examination. She was reminded to followup with her internist since she is overdue and we can forward him or he can look on epic the labs drawn today. Patient was reminded also to followup with her dermatologist for her bone check and especially since her past history of BCC. She was reminded of the importance of calcium and vitamin D for osteoporosis and to schedule her bone density study. We also discussed importance of weightbearing exercises for 45 minutes 3-4 times a week. She is to submit to the office Hemoccult cards for testing.  Ok Edwards MD, 12:42 PM 10/11/2011

## 2011-10-11 NOTE — Patient Instructions (Addendum)

## 2011-10-12 ENCOUNTER — Encounter: Payer: Self-pay | Admitting: Gynecology

## 2011-10-12 LAB — URINALYSIS W MICROSCOPIC + REFLEX CULTURE
Bilirubin Urine: NEGATIVE
Crystals: NONE SEEN
Specific Gravity, Urine: <1.005 — ABNORMAL LOW (ref 1.005–1.030)
Squamous Epithelial / LPF: NONE SEEN
Urobilinogen, UA: 0.2 mg/dL (ref 0.0–1.0)

## 2011-10-12 LAB — TSH: TSH: 1.098 u[IU]/mL (ref 0.350–4.500)

## 2011-10-26 ENCOUNTER — Ambulatory Visit (INDEPENDENT_AMBULATORY_CARE_PROVIDER_SITE_OTHER): Payer: BC Managed Care – PPO

## 2011-10-26 DIAGNOSIS — Z13820 Encounter for screening for osteoporosis: Secondary | ICD-10-CM

## 2011-10-26 DIAGNOSIS — Z780 Asymptomatic menopausal state: Secondary | ICD-10-CM

## 2011-10-31 ENCOUNTER — Ambulatory Visit (INDEPENDENT_AMBULATORY_CARE_PROVIDER_SITE_OTHER): Payer: BC Managed Care – PPO | Admitting: Medical

## 2011-10-31 ENCOUNTER — Encounter: Payer: Self-pay | Admitting: Medical

## 2011-10-31 VITALS — BP 120/80 | HR 60 | Temp 98.1°F | Resp 16 | Wt 152.0 lb

## 2011-10-31 DIAGNOSIS — R198 Other specified symptoms and signs involving the digestive system and abdomen: Secondary | ICD-10-CM

## 2011-10-31 DIAGNOSIS — I10 Essential (primary) hypertension: Secondary | ICD-10-CM

## 2011-10-31 DIAGNOSIS — R002 Palpitations: Secondary | ICD-10-CM

## 2011-10-31 DIAGNOSIS — E785 Hyperlipidemia, unspecified: Secondary | ICD-10-CM

## 2011-10-31 DIAGNOSIS — F458 Other somatoform disorders: Secondary | ICD-10-CM

## 2011-10-31 DIAGNOSIS — Z23 Encounter for immunization: Secondary | ICD-10-CM

## 2011-10-31 MED ORDER — LISINOPRIL 5 MG PO TABS: 5.0000 mg | ORAL_TABLET | Freq: Every day | ORAL | Status: DC

## 2011-10-31 MED ORDER — SIMVASTATIN 10 MG PO TABS: 10.0000 mg | ORAL_TABLET | Freq: Every day | ORAL | Status: DC

## 2011-10-31 MED ORDER — ASPIRIN 81 MG PO TBEC: 81.0000 mg | DELAYED_RELEASE_TABLET | Freq: Every day | ORAL | Status: AC

## 2011-10-31 NOTE — Addendum Note (Signed)
Addended by: Janeice Robinson on: 10/31/2011 02:59 PM   Modules accepted: Orders

## 2011-10-31 NOTE — Progress Notes (Signed)
Subjective:   HPI  Sherri Rangel is a 56 y.o. female who presents for general recheck.  She recently saw her gynecologist for routine screening, and had panel of labs.    She notes occasional fluttering of her heart.  This is not frequent, but is strange and wonders if this is something to be concerned about.   Occasional gets a "rush" like feeling with palpitations, occasional brief lightheadedness.  The symptoms always lasts for just a few seconds.   Denies associated SOB, dizziness, syncope, chest pain.  No prior similar.  She does exercise daily with moderate intensity exercise without c/o.  Denies lots of caffeine use.   She notes clinching at night with her teeth.  Dentist confirms she is grinding her teeth.  She notes she at times is under more stress at time.  But she retired this summer, and she doesn't have work stress currently.  Denies agitation, mood swings, depression.  Feels that she is easy going.   No other aggravating or relieving factors.    She is on medication for blood pressure and lipids, compliant with diet, exercises, and medication.   No other c/o.  The following portions of the patient's history were reviewed and updated as appropriate: allergies, current medications, past family history, past medical history, past social history, past surgical history and problem list.  Past Medical History  Diagnosis Date  . Hyperlipidemia   . Iron deficiency 06/2009    negative EGD and colonoscopy 08/2009  . BCC (basal cell carcinoma)     right eye, LLE  . Colon polyp 08/2009    WNL, internal hemorrhoid (Dr. Kinnie Scales); also had colonoscopy 2008 with Dr. Leone Payor  . Hypertension   . Anemia     No Known Allergies   Review of Systems ROS reviewed and was negative other than noted in HPI or above.    Objective:   Physical Exam  General appearance: alert, no distress, WD/WN, lean white female Oral cavity: MMM, no lesions Neck: supple, no lymphadenopathy, no  thyromegaly, no masses, no bruits Heart: RRR, normal S1, S2, no murmurs Lungs: CTA bilaterally, no wheezes, rhonchi, or rales Abdomen: +bs, soft, non tender, non distended, no masses, no hepatomegaly, no splenomegaly Pulses: 2+ symmetric, upper and lower extremities, normal cap refill Ext: no edema   Adult ECG Report  Indication: palpitations  Rate: 60bpm  Rhythm: normal sinus rhythm  QRS Axis: -3 degrees  PR Interval:  QRS Duration: 90ms  QTc:  Conduction Disturbances: none  Other Abnormalities: none  Patient's cardiac risk factors are: dyslipidemia and hypertension.  EKG comparison: 09/19/2007  Narrative Interpretation: no acute changes.  Only difference is axis change from normal.  Assessment and Plan :     Encounter Diagnoses  Name Primary?  . Palpitations Yes  . Essential hypertension, benign   . Hyperlipidemia   . Teeth clenching   . Need for influenza vaccination    Reviewed full lab panel she had done late in August through gynecology, all normal labs.   Palpitations - reviewed EKG today.  discussed the symptoms, possible causes.  Since she really has no worrisome changes currently we will use watch and wait approach.  Advised if new or worsening symptoms suggestive of arrhythmia, or associated CP, SOB, DOE, syncope/presyncope, then let me know for referral. Advised she begin Aspirin 81mg  daily.   HTN - controlled on current medication, Lisinopril 5mg  daily.  Hyperlipidemia - controlled on Lipitor 10mg  daily  Teeth clenching - discussed  stress relief, relaxation technique, can consider dental appliance through dentist.  Flu vaccine, VIS and counseling given.

## 2012-04-29 ENCOUNTER — Ambulatory Visit: Payer: BC Managed Care – PPO | Admitting: Medical

## 2012-05-01 ENCOUNTER — Encounter: Payer: Self-pay | Admitting: Medical

## 2012-05-01 ENCOUNTER — Ambulatory Visit (INDEPENDENT_AMBULATORY_CARE_PROVIDER_SITE_OTHER): Payer: BC Managed Care – PPO | Admitting: Medical

## 2012-05-01 VITALS — BP 120/80 | HR 65 | Temp 98.2°F | Resp 16 | Wt 147.0 lb

## 2012-05-01 DIAGNOSIS — Z8262 Family history of osteoporosis: Secondary | ICD-10-CM

## 2012-05-01 DIAGNOSIS — I10 Essential (primary) hypertension: Secondary | ICD-10-CM

## 2012-05-01 DIAGNOSIS — Z79899 Other long term (current) drug therapy: Secondary | ICD-10-CM

## 2012-05-01 LAB — HEPATIC FUNCTION PANEL
ALT: 15 U/L (ref 0–35)
Albumin: 4.6 g/dL (ref 3.5–5.2)
Indirect Bilirubin: 0.4 mg/dL (ref 0.0–0.9)
Total Protein: 7.3 g/dL (ref 6.0–8.3)

## 2012-05-01 NOTE — Progress Notes (Signed)
  Subjective:   HPI  Sherri Rangel is a 57 y.o. female who presents for routine f/u on lipids and blood pressure.  No medication side effects.  Been doing well.   Exercises regularly with video aerobics with friends, eats healthy except she likes candy.  She is up to date on mammogram, pap with gynecology and up to date on colonoscopy.  Has 2 small bruises on right forearm. Has had other similar ones that come and go.  No other c/o.  The following portions of the patient's history were reviewed and updated as appropriate: allergies, current medications, past family history, past medical history, past social history, past surgical history and problem list.  Past Medical History  Diagnosis Date  . Hyperlipidemia   . Iron deficiency 06/2009    negative EGD and colonoscopy 08/2009  . BCC (basal cell carcinoma)     right eye, LLE  . Colon polyp 08/2009    WNL, internal hemorrhoid (Dr. Kinnie Scales); also had colonoscopy 2008 with Dr. Leone Payor  . Hypertension   . Anemia    Review of Systems Constitutional: -fever, -chills, -sweats, -unexpected -weight change,-fatigue ENT: -runny nose, -ear pain, -sore throat Cardiology:  -chest pain, -palpitations, -edema Respiratory: -cough, -shortness of breath, -wheezing Gastroenterology: -abdominal pain, -nausea, -vomiting, -diarrhea, -constipation Hematology: -bleeding or bruising problems Musculoskeletal: -arthralgias, -myalgias, -joint swelling, -back pain Ophthalmology: -vision changes Urology: -dysuria, -difficulty urinating, -hematuria, -urinary frequency, -urgency Neurology: -headache, -weakness, -tingling, -numbness      Objective:   Physical Exam  Filed Vitals:   05/01/12 1541  BP: 120/80  Pulse: 65  Temp: 98.2 F (36.8 C)  Resp: 16    General appearance: alert, no distress, WD/WN, lean white female Heart: RRR, normal S1, S2, no murmurs Lungs: CTA bilaterally, no wheezes, rhonchi, or rales Abdomen: +bs, soft, non tender, non distended,  no masses, no hepatomegaly, no splenomegaly Pulses: 2+ symmetric, upper and lower extremities, normal cap refill Skin: 2 small 2-53mm diameter oval flat areas of purplish nonblanchable lesions on right forearm, purpura vs ecchymosis  Assessment and Plan :    Encounter Diagnoses  Name Primary?  . Hyperlipidemia Yes  . Encounter for long-term (current) use of other medications   . Family history of osteoporosis   . Essential hypertension, benign   . Bruising    Hyperlipidemia - last several lipid panels look fine . C/t current medication.  Hepatic panel today  Family hx/o osteoporosis - recent bone density testing through gynecology reviewed.   We will check vit D today.  HTN - controlled on current medication  Bruising - reassured.   If worsening, let me know.

## 2012-09-19 ENCOUNTER — Encounter: Payer: Self-pay | Admitting: Internal Medicine

## 2012-09-19 LAB — HM MAMMOGRAPHY

## 2012-09-23 ENCOUNTER — Encounter: Payer: Self-pay | Admitting: Gynecology

## 2012-10-03 ENCOUNTER — Telehealth: Payer: Self-pay | Admitting: *Deleted

## 2012-10-03 DIAGNOSIS — Z8543 Personal history of malignant neoplasm of ovary: Secondary | ICD-10-CM

## 2012-10-03 NOTE — Telephone Encounter (Signed)
Front desk with schedule, order placed

## 2012-10-03 NOTE — Telephone Encounter (Signed)
Sherri Rangel be done the same day

## 2012-10-03 NOTE — Telephone Encounter (Signed)
Pt has annual schedule on 10/11/12 pt asked if ultrasound could be done same day? Per note "patient's mother with history of ovarian cancer yearly ultrasound and CA 125 is being done" please advise

## 2012-10-11 ENCOUNTER — Ambulatory Visit (INDEPENDENT_AMBULATORY_CARE_PROVIDER_SITE_OTHER): Payer: BC Managed Care – PPO | Admitting: Gynecology

## 2012-10-11 ENCOUNTER — Encounter: Payer: Self-pay | Admitting: Gynecology

## 2012-10-11 ENCOUNTER — Encounter: Payer: BC Managed Care – PPO | Admitting: Gynecology

## 2012-10-11 ENCOUNTER — Ambulatory Visit (INDEPENDENT_AMBULATORY_CARE_PROVIDER_SITE_OTHER): Payer: BC Managed Care – PPO

## 2012-10-11 VITALS — BP 138/88 | Ht 67.25 in | Wt 154.0 lb

## 2012-10-11 DIAGNOSIS — N951 Menopausal and female climacteric states: Secondary | ICD-10-CM

## 2012-10-11 DIAGNOSIS — M948X9 Other specified disorders of cartilage, unspecified sites: Secondary | ICD-10-CM

## 2012-10-11 DIAGNOSIS — Z1159 Encounter for screening for other viral diseases: Secondary | ICD-10-CM

## 2012-10-11 DIAGNOSIS — Z01419 Encounter for gynecological examination (general) (routine) without abnormal findings: Secondary | ICD-10-CM

## 2012-10-11 DIAGNOSIS — D649 Anemia, unspecified: Secondary | ICD-10-CM

## 2012-10-11 DIAGNOSIS — M898X9 Other specified disorders of bone, unspecified site: Secondary | ICD-10-CM

## 2012-10-11 DIAGNOSIS — Z8041 Family history of malignant neoplasm of ovary: Secondary | ICD-10-CM

## 2012-10-11 DIAGNOSIS — Z8543 Personal history of malignant neoplasm of ovary: Secondary | ICD-10-CM

## 2012-10-11 DIAGNOSIS — Z780 Asymptomatic menopausal state: Secondary | ICD-10-CM

## 2012-10-11 NOTE — Progress Notes (Signed)
Sherri Rangel 1955-08-15 [id number]   History:    57 y.o.  for annual gyn exam with no complaints today. She has history of hypertension hypercholesterolemia and is being followed by Dr.LaLonde who she has not seen him in over year. Please see medicine list for details. Patient with history of colon polyp in 2008 followup colonoscopy 2012 was normal. Patient with past history of anemia last CBC last year was normal. Patient has strong family history of ovarian cancer were as her mother had ovarian cancer at the age of 74. The patient's last bone density study was normal in 2013 although there was some bone loss but her calcium vitamin D levels were normal and she's currently taking her supplements daily. Patient has been followed by her dermatologist for basal cell carcinoma. 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 She recently saw her dermatologist for removal check. Last year patient was using Vagifem for vaginal atrophy and is no longer needing it but stated that she is no longer needing it. Patient with no prior history of abnormal Pap smears. Patient reached menopause at the age of 65. Because of her family history of ovarian cancer and ultrasound was done today that we do yearly along with a CA 125 despite its limitations.  Ultrasound uterus measured 5.9 x 5.0 x 3.1 with endometrial stripe of 2 mm. Normal ovaries and uterus  Patient with past history of right breast biopsy benign  Past medical history,surgical history, family history and social history were all reviewed and documented in the EPIC chart.  Gynecologic History No LMP recorded. Patient is postmenopausal. Contraception: post menopausal status Last Pap: 2013. Results were: normal Last mammogram: 2014. Results were: normal  Obstetric History OB History  Gravida Para Term Preterm AB SAB TAB Ectopic Multiple Living  3 3        3     # Outcome Date GA Lbr Len/2nd Weight Sex Delivery  Anes PTL Lv  3 PAR           2 PAR           1 PAR                ROS: A ROS was performed and pertinent positives and negatives are included in the history.  GENERAL: No fevers or chills. HEENT: No change in vision, no earache, sore throat or sinus congestion. NECK: No pain or stiffness. CARDIOVASCULAR: No chest pain or pressure. No palpitations. PULMONARY: No shortness of breath, cough or wheeze. GASTROINTESTINAL: No abdominal pain, nausea, vomiting or diarrhea, melena or bright red blood per rectum. GENITOURINARY: No urinary frequency, urgency, hesitancy or dysuria. MUSCULOSKELETAL: No joint or muscle pain, no back pain, no recent trauma. DERMATOLOGIC: No rash, no itching, no lesions. ENDOCRINE: No polyuria, polydipsia, no heat or cold intolerance. No recent change in weight. HEMATOLOGICAL: No anemia or easy bruising or bleeding. NEUROLOGIC: No headache, seizures, numbness, tingling or weakness. PSYCHIATRIC: No depression, no loss of interest in normal activity or change in sleep pattern.     Exam: chaperone present  BP 138/88  Ht 5' 7.25" (1.708 m)  Wt 154 lb (69.854 kg)  BMI 23.95 kg/m2  Body mass index is 23.95 kg/(m^2).  General appearance : Well developed well nourished female. No acute distress HEENT: Neck supple, trachea midline, no carotid bruits, no thyroidmegaly Lungs: Clear to auscultation, no rhonchi or wheezes, or rib retractions  Heart: Regular rate and rhythm, no  murmurs or gallops Breast:Examined in sitting and supine position were symmetrical in appearance, no palpable masses or tenderness,  no skin retraction, no nipple inversion, no nipple discharge, no skin discoloration, no axillary or supraclavicular lymphadenopathy Abdomen: no palpable masses or tenderness, no rebound or guarding Extremities: no edema or skin discoloration or tenderness  Pelvic:  Bartholin, Urethra, Skene Glands: Within normal limits             Vagina: No gross lesions or discharge  Cervix:  No gross lesions or discharge  Uterus  anteverted, normal size, shape and consistency, non-tender and mobile  Adnexa  Without masses or tenderness  Anus and perineum  normal   Rectovaginal  normal sphincter tone without palpated masses or tenderness             Hemoccult card provided     Assessment/Plan:  56 y.o. female for annual exam no longer bothered by vaginal atrophy. Patient was reminded thicker calcium vitamin D and regular exercise for osteoporosis prevention. Because of her family history of ovarian cancer a CA 125 was drawn today. Hemoccult cards were provided the cement in the office for testing at a later date. Her PCP will be drawn her lab work. No Pap smear was done today and new guidelines were discussed.  New CDC guidelines is recommending patients be tested once in her lifetime for hepatitis C antibody who were born between [birth year] through 1967. This was discussed with the patient today and has agreed to be tested today.      Ok Edwards MD, 9:35 AM 10/11/2012

## 2012-10-11 NOTE — Patient Instructions (Signed)
Shingles Vaccine What You Need to Know WHAT IS SHINGLES?  Shingles is a painful skin rash, often with blisters. It is also called Herpes Zoster or just Zoster.  A shingles rash usually appears on one side of the face or body and lasts from 2 to 4 weeks. Its main symptom is pain, which can be quite severe. Other symptoms of shingles can include fever, headache, chills, and upset stomach. Very rarely, a shingles infection can lead to pneumonia, hearing problems, blindness, brain inflammation (encephalitis), or death.  For about 1 person in 5, severe pain can continue even after the rash clears up. This is called post-herpetic neuralgia.  Shingles is caused by the Varicella Zoster virus. This is the same virus that causes chickenpox. Only someone who has had a case of chickenpox or rarely, has gotten chickenpox vaccine, can get shingles. The virus stays in your body. It can reappear many years later to cause a case of shingles.  You cannot catch shingles from another person with shingles. However, a person who has never had chickenpox (or chickenpox vaccine) could get chickenpox from someone with shingles. This is not very common.  Shingles is far more common in people 50 and older than in younger people. It is also more common in people whose immune systems are weakened because of a disease such as cancer or drugs such as steroids or chemotherapy.  At least 1 million people get shingles per year in the [place]. SHINGLES VACCINE  A vaccine for shingles was licensed in 2006. In clinical trials, the vaccine reduced the risk of shingles by 50%. It can also reduce the pain in people who still get shingles after being vaccinated.  A single dose of shingles vaccine is recommended for adults 60 years of age and older. SOME PEOPLE SHOULD NOT GET SHINGLES VACCINE OR SHOULD WAIT A person should not get shingles vaccine if he or she:  Has ever had a life-threatening allergic reaction to gelatin, the  antibiotic neomycin, or any other component of shingles vaccine. Tell your caregiver if you have any severe allergies.  Has a weakened immune system because of current:  AIDS or another disease that affects the immune system.  Treatment with drugs that affect the immune system, such as prolonged use of high-dose steroids.  Cancer treatment, such as radiation or chemotherapy.  Cancer affecting the bone marrow or lymphatic system, such as leukemia or lymphoma.  Is pregnant, or might be pregnant. Women should not become pregnant until at least 4 weeks after getting shingles vaccine. Someone with a minor illness, such as a cold, may be vaccinated. Anyone with a moderate or severe acute illness should usually wait until he or she recovers before getting the vaccine. This includes anyone with a temperature of 101.3 F (38 C) or higher. WHAT ARE THE RISKS FROM SHINGLES VACCINE?  A vaccine, like any medicine, could possibly cause serious problems, such as severe allergic reactions. However, the risk of a vaccine causing serious harm, or death, is extremely small.  No serious problems have been identified with shingles vaccine. Mild Problems  Redness, soreness, swelling, or itching at the site of the injection (about 1 person in 3).  Headache (about 1 person in 70). Like all vaccines, shingles vaccine is being closely monitored for unusual or severe problems. WHAT IF THERE IS A MODERATE OR SEVERE REACTION? What should I look for? Any unusual condition, such as a severe allergic reaction or a high fever. If a severe allergic reaction   occurred, it would be within a few minutes to an hour after the shot. Signs of a serious allergic reaction can include difficulty breathing, weakness, hoarseness or wheezing, a fast heartbeat, hives, dizziness, paleness, or swelling of the throat. What should I do?  Call your caregiver, or get the person to a caregiver right away.  Tell the caregiver what  happened, the date and time it happened, and when the vaccination was given.  Ask the caregiver to report the reaction by filing a Vaccine Adverse Event Reporting System (VAERS) form. Or, you can file this report through the VAERS web site at www.vaers.hhs.gov or by calling 1-800-822-7967. VAERS does not provide medical advice. HOW CAN I LEARN MORE?  Ask your caregiver. He or she can give you the vaccine package insert or suggest other sources of information.  Contact the Centers for Disease Control and Prevention (CDC):  Call 1-800-232-4636 (1-800-CDC-INFO).  Visit the CDC website at www.cdc.gov/vaccines CDC Shingles Vaccine VIS (11/26/07) Document Released: 12/04/2005 Document Revised: 05/01/2011 Document Reviewed: 11/26/2007 [employer] Patient Information 2014 [employer].  

## 2012-10-18 ENCOUNTER — Other Ambulatory Visit: Payer: Self-pay | Admitting: Medical

## 2012-10-28 ENCOUNTER — Encounter: Payer: Self-pay | Admitting: Anesthesiology

## 2012-10-30 ENCOUNTER — Other Ambulatory Visit: Payer: BC Managed Care – PPO | Admitting: Anesthesiology

## 2012-10-30 DIAGNOSIS — Z1211 Encounter for screening for malignant neoplasm of colon: Secondary | ICD-10-CM

## 2012-10-31 ENCOUNTER — Other Ambulatory Visit: Payer: Self-pay | Admitting: Medical

## 2012-12-26 ENCOUNTER — Other Ambulatory Visit: Payer: Self-pay

## 2013-01-14 ENCOUNTER — Other Ambulatory Visit: Payer: Self-pay | Admitting: Medical

## 2013-03-04 ENCOUNTER — Ambulatory Visit (INDEPENDENT_AMBULATORY_CARE_PROVIDER_SITE_OTHER): Payer: BC Managed Care – PPO | Admitting: Medical

## 2013-03-04 ENCOUNTER — Encounter: Payer: Self-pay | Admitting: Medical

## 2013-03-04 VITALS — BP 120/80 | HR 68 | Temp 98.1°F | Resp 16 | Wt 150.0 lb

## 2013-03-04 DIAGNOSIS — B351 Tinea unguium: Secondary | ICD-10-CM

## 2013-03-04 DIAGNOSIS — I10 Essential (primary) hypertension: Secondary | ICD-10-CM

## 2013-03-04 DIAGNOSIS — D649 Anemia, unspecified: Secondary | ICD-10-CM

## 2013-03-04 DIAGNOSIS — E785 Hyperlipidemia, unspecified: Secondary | ICD-10-CM

## 2013-03-04 LAB — CBC
HCT: 37.8 % (ref 36.0–46.0)
Hemoglobin: 12.7 g/dL (ref 12.0–15.0)
MCH: 28.8 pg (ref 26.0–34.0)
MCHC: 33.6 g/dL (ref 30.0–36.0)
MCV: 85.7 fL (ref 78.0–100.0)
PLATELETS: 284 10*3/uL (ref 150–400)
RBC: 4.41 MIL/uL (ref 3.87–5.11)
RDW: 14.3 % (ref 11.5–15.5)
WBC: 6.5 10*3/uL (ref 4.0–10.5)

## 2013-03-04 LAB — COMPREHENSIVE METABOLIC PANEL
ALK PHOS: 76 U/L (ref 39–117)
ALT: 18 U/L (ref 0–35)
AST: 23 U/L (ref 0–37)
Albumin: 4.3 g/dL (ref 3.5–5.2)
BILIRUBIN TOTAL: 0.5 mg/dL (ref 0.3–1.2)
BUN: 18 mg/dL (ref 6–23)
CO2: 28 meq/L (ref 19–32)
CREATININE: 0.77 mg/dL (ref 0.50–1.10)
Calcium: 9.3 mg/dL (ref 8.4–10.5)
Chloride: 106 mEq/L (ref 96–112)
Glucose, Bld: 86 mg/dL (ref 70–99)
Potassium: 4.5 mEq/L (ref 3.5–5.3)
Sodium: 141 mEq/L (ref 135–145)
Total Protein: 6.7 g/dL (ref 6.0–8.3)

## 2013-03-04 LAB — LIPID PANEL
CHOL/HDL RATIO: 2.8 ratio
CHOLESTEROL: 171 mg/dL (ref 0–200)
HDL: 62 mg/dL (ref >39)
LDL Cholesterol: 97 mg/dL (ref 0–99)
Triglycerides: 60 mg/dL (ref <150)
VLDL: 12 mg/dL (ref 0–40)

## 2013-03-04 MED ORDER — TERBINAFINE HCL 250 MG PO TABS: 250.0000 mg | ORAL_TABLET | Freq: Every day | ORAL | Status: DC

## 2013-03-04 MED ORDER — SIMVASTATIN 10 MG PO TABS: 10.0000 mg | ORAL_TABLET | Freq: Every day | ORAL | Status: DC

## 2013-03-04 MED ORDER — LISINOPRIL 5 MG PO TABS: 5.0000 mg | ORAL_TABLET | Freq: Every day | ORAL | Status: DC

## 2013-03-04 NOTE — Progress Notes (Signed)
  Subjective:  Sherri Rangel is a 58 y.o. female who presents for routine f/u on hypertension and hyperlipidemia.   Here for follow-up of hypertension. Does exercise with friends with videos with aerobics, uses the basement of a church.  Does this up to 5 x per week.   Is adherent to a low-salt diet.  Does not check BPs at home as they have always been controlled.  Cardiovascular risk factors: dyslipidemia and hypertension. Use of agents associated with hypertension: none. History of target organ damage: none.  Diet is healthy, although she does eat chocolate on occasions.     Here for follow up of elevated lipids. Compliance with treatment has been excellent. Patient denies muscle pain associated with female medications.    He reports getting a rare brief body sensation in the chest like a bubbling, and this lasts for seconds. Denies lower extremity swelling, nausea, sweats, shortness of breath or other chest pain. This was happening a little more frequently when she was working full-time, thus she attributed this to stress. No prior stress test or cardiac evaluation.  No first degree relative with heart disease.   He has a new complaint of toenails being more yellow and her right second toe thickened over the past year. this is worse on the right foot compared to left.  No other c/o.  Past Medical History  Diagnosis Date  . Hyperlipidemia   . Iron deficiency 06/2009    negative EGD and colonoscopy 08/2009  . BCC (basal cell carcinoma)     right eye, LLE  . Colon polyp 08/2009    WNL, internal hemorrhoid (Dr. Earlean Shawl); also had colonoscopy 2008 with Dr. Carlean Purl  . Hypertension   . Anemia    Family History  Problem Relation Age of Onset  . Cancer Mother 60    ovarian cancer  . Hypertension Mother   . Osteoporosis Mother   . Cancer Father     esophageal  . Alcohol abuse Father   . Hyperlipidemia Father   . Diabetes Maternal  Aunt   . Diabetes Cousin   . Diabetes Cousin     The following portions of the patient's history were reviewed and updated as appropriate: allergies, current medications, past family history, past medical history, past social history, past surgical history and problem list.  ROS Otherwise as in subjective above  Objective: Physical Exam BP 120/80  Pulse 68  Temp(Src) 98.1 F (36.7 C) (Oral)  Resp 16  Wt 150 lb (68.04 kg)   General appearance: alert, no distress, WD/WN Neck: supple, no lymphadenopathy, no thyromegaly, no masses, no bruits Heart: RRR, normal S1, S2, no murmurs Lungs: CTA bilaterally, no wheezes, rhonchi, or rales Abdomen: +bs, soft, non tender, non distended, no masses, no hepatomegaly, no splenomegaly Pulses: 2+ radial pulses, 2+ pedal pulses, normal cap refill Ext: no edema Nails: yellowish coloration of the nails, worse on the right foot compared to left, right second toe thickening   Assessment: Encounter Diagnoses  Name Primary?  . Essential hypertension, benign Yes  . Hyperlipidemia   . Anemia   . Onychomycosis   '   Plan: Hypertension-controlled on current medication, continue same medication, labs today Hyperlipidemia- controlled on current medication, continue same medication, labs today Anemia-CBC for surveillance, asymptomatic Onychomycosis-discussed diagnosis, medication, routine follow, begin Lamisil oral daily.  Advise it may take several weeks to even months to resolve Follow up: Pending labs, 6 weeks on an onychomycosis

## 2013-03-18 ENCOUNTER — Encounter: Payer: Self-pay | Admitting: Family Medicine

## 2013-03-31 ENCOUNTER — Ambulatory Visit (INDEPENDENT_AMBULATORY_CARE_PROVIDER_SITE_OTHER): Payer: BC Managed Care – PPO | Admitting: Medical

## 2013-03-31 ENCOUNTER — Encounter: Payer: Self-pay | Admitting: Medical

## 2013-03-31 VITALS — BP 128/88 | HR 72 | Temp 99.8°F | Resp 16 | Wt 154.0 lb

## 2013-03-31 DIAGNOSIS — J029 Acute pharyngitis, unspecified: Secondary | ICD-10-CM

## 2013-03-31 DIAGNOSIS — J329 Chronic sinusitis, unspecified: Secondary | ICD-10-CM

## 2013-03-31 LAB — POCT RAPID STREP A (OFFICE): RAPID STREP A SCREEN: NEGATIVE

## 2013-03-31 MED ORDER — AMOXICILLIN 500 MG PO TABS
ORAL_TABLET | ORAL | Status: DC
Start: 2013-03-31 — End: 2013-04-22

## 2013-03-31 NOTE — Addendum Note (Signed)
Addended by: Armanda Magic on: 03/31/2013 09:11 AM   Modules accepted: Orders

## 2013-03-31 NOTE — Progress Notes (Signed)
Subjective:  Sherri Rangel is a 58 y.o. female who presents for possible sinus infection.  Symptoms include left nasal area seems swollen, teeth pain on the left, mild sore throat, day 6 of the sinus pressure, feverish up to 99 most of the last 6 days.  Rare cough.  Denies NVD.  No ear pressure or pain.  Pt is not a smoker.  Using Claritin, mucinex, Nyquil, Advil for symptoms.  Denies sick contacts.  No other aggravating or relieving factors.  No other c/o.  ROS as in subjective    Objective: Filed Vitals:   03/31/13 0843  BP: 128/88  Pulse: 72  Temp: 99.8 F (37.7 C)  Resp: 16    General appearance: Alert, WD/WN, no distress                             Skin: warm, no rash                           Head: +left maxillary sinus tenderness                            Eyes: conjunctiva normal, corneas clear, PERRLA                            Ears: pearly TMs, external ear canals normal                          Nose: septum midline, turbinates swollen, with erythema and mucoid discharge             Mouth/throat: MMM, tongue normal, mild pharyngeal erythema                           Neck: supple, no adenopathy, no thyromegaly, nontender                         Lungs: CTA bilaterally, no wheezes, rales, or rhonchi      Assessment and Plan:   Encounter Diagnoses  Name Primary?  . Sinusitis Yes  . Sore throat    Prescription given for Amoxicillin.  Can use OTC Mucinex for congestion.  Tylenol or Ibuprofen OTC for fever and malaise.  Discussed symptomatic relief, nasal saline flush, and call or return if worse or not improving in 2-3 days.

## 2013-04-22 ENCOUNTER — Ambulatory Visit (INDEPENDENT_AMBULATORY_CARE_PROVIDER_SITE_OTHER): Payer: BC Managed Care – PPO | Admitting: Medical

## 2013-04-22 VITALS — BP 120/88 | HR 62 | Temp 98.0°F | Wt 156.0 lb

## 2013-04-22 DIAGNOSIS — B351 Tinea unguium: Secondary | ICD-10-CM

## 2013-04-22 DIAGNOSIS — Z807 Family history of other malignant neoplasms of lymphoid, hematopoietic and related tissues: Secondary | ICD-10-CM

## 2013-04-22 DIAGNOSIS — Z79899 Other long term (current) drug therapy: Secondary | ICD-10-CM

## 2013-04-22 LAB — ALT: ALT: 16 U/L (ref 0–35)

## 2013-04-22 MED ORDER — TERBINAFINE HCL 250 MG PO TABS: 250.0000 mg | ORAL_TABLET | Freq: Every day | ORAL | Status: DC

## 2013-04-22 NOTE — Progress Notes (Signed)
Subjective: Here for 2 concerns.   Started Lamisil oral in January for onychomycosis.  No improvements yet, but here for lab surveillance for the liver.  She notes that her brother was diagnosed a few weeks ago with multiple myeloma.  His oncologist recommended she and her other siblings get tested with SPEP.  She is having no symptoms . Brother had a pathological fracture that tipped off the diagnosis.    No other c/o.   Objective: Gen: wd, wn, nad  Assessment: Encounter Diagnoses  Name Primary?  Marland Kitchen Onychomycosis Yes  . High risk medication use   . Family history of multiple myeloma     Plan: Onychomycosis-continue Lamisil, liver test today high medication-liver tests due to Lamisil use today Family history multiple myeloma - baseline SPEP today, per oncologist recommendation

## 2013-04-24 LAB — PROTEIN ELECTROPHORESIS, SERUM
ALPHA-2-GLOBULIN: 10.1 % (ref 7.1–11.8)
Albumin ELP: 60.4 % (ref 55.8–66.1)
Alpha-1-Globulin: 3.7 % (ref 2.9–4.9)
BETA 2: 4 % (ref 3.2–6.5)
BETA GLOBULIN: 7.2 % (ref 4.7–7.2)
GAMMA GLOBULIN: 14.6 % (ref 11.1–18.8)
Total Protein, Serum Electrophoresis: 6.6 g/dL (ref 6.0–8.3)

## 2013-08-29 ENCOUNTER — Other Ambulatory Visit: Payer: Self-pay | Admitting: Medical

## 2013-08-29 NOTE — Telephone Encounter (Signed)
Is this okay to refill? 

## 2013-08-29 NOTE — Telephone Encounter (Signed)
Patient states that she has been using this continuously. She wants to know if she needs to come in and get her liver check again. CLS

## 2013-08-29 NOTE — Telephone Encounter (Signed)
Has she been using this continuously or does she feel like the fungus is back and wants to restart this?

## 2013-09-01 NOTE — Telephone Encounter (Signed)
I do recommend recheck and labs.  By this point the nails should either be looking fine or we need to stop the medication or consider other options.

## 2013-09-01 NOTE — Telephone Encounter (Signed)
Pt scheduled for 09/02/13

## 2013-09-02 ENCOUNTER — Ambulatory Visit (INDEPENDENT_AMBULATORY_CARE_PROVIDER_SITE_OTHER): Payer: BC Managed Care – PPO | Admitting: Medical

## 2013-09-02 ENCOUNTER — Encounter: Payer: Self-pay | Admitting: Medical

## 2013-09-02 VITALS — BP 100/70 | HR 60 | Temp 98.1°F | Resp 14 | Ht 68.0 in | Wt 157.0 lb

## 2013-09-02 DIAGNOSIS — B351 Tinea unguium: Secondary | ICD-10-CM

## 2013-09-02 DIAGNOSIS — Z79899 Other long term (current) drug therapy: Secondary | ICD-10-CM

## 2013-09-02 NOTE — Progress Notes (Signed)
   Subjective:    Patient ID: Sherri Rangel, female    DOB: August 18, 1955, 58 y.o.   MRN: [id number]  HPI  Patient is presenting to have a liver enzyme test due to being on oral Lamisil. She has been on Lamisil for months to treat onychomycosis in her feet bilaterally. Initially, patient presented with deformed, thick toenails of her 2nd toes bilaterally. She notes a slight but not significant difference from having used Lamisil. She has been airing out her feet as well and avoids shoes that keep her feet moist. Admits that her nails do not bother her apart from how they look.  Denies pruritus, bleeding, foul odor, no white plaques. No other aggravating or relieving factors.  Review of Systems As in subjective.    Objective:   Physical Exam  BP 100/70  Pulse 60  Temp(Src) 98.1 F (36.7 C)  Resp 14  Ht 5\' 8"  (1.727 m)  Wt 157 lb (71.215 kg)  BMI 23.88 kg/m2  General appearance: alert, no distress, WD/WN,  Skin: warm and dry, no lesions noted Pulses: 2+ symmetric, upper and lower extremities, normal cap refill Lower ext: toenails on 2nd digit bilat are slightly thickened and raised, otherwise normal      Assessment & Plan:   Encounter Diagnoses  Name Primary?  Marland Kitchen Onychomycosis Yes  . High risk medication use    Hepatic panel today, stop Lamisil oral. We discussed other options including Penlac lacquer, [place]ublia.  She instead will go to dermatology as she needs to see them for skin surveillance anyhow.

## 2013-09-03 LAB — HEPATIC FUNCTION PANEL
ALBUMIN: 4.4 g/dL (ref 3.5–5.2)
ALK PHOS: 68 U/L (ref 39–117)
ALT: 15 U/L (ref 0–35)
AST: 23 U/L (ref 0–37)
Bilirubin, Direct: 0.1 mg/dL (ref 0.0–0.3)
Indirect Bilirubin: 0.2 mg/dL (ref 0.2–1.2)
TOTAL PROTEIN: 6.5 g/dL (ref 6.0–8.3)
Total Bilirubin: 0.3 mg/dL (ref 0.2–1.2)

## 2013-10-08 ENCOUNTER — Encounter: Payer: Self-pay | Admitting: Gynecology

## 2013-10-14 ENCOUNTER — Ambulatory Visit (INDEPENDENT_AMBULATORY_CARE_PROVIDER_SITE_OTHER): Payer: BC Managed Care – PPO | Admitting: Gynecology

## 2013-10-14 ENCOUNTER — Encounter: Payer: Self-pay | Admitting: Gynecology

## 2013-10-14 VITALS — BP 128/82 | Ht 68.0 in | Wt 156.0 lb

## 2013-10-14 DIAGNOSIS — N951 Menopausal and female climacteric states: Secondary | ICD-10-CM

## 2013-10-14 DIAGNOSIS — N952 Postmenopausal atrophic vaginitis: Secondary | ICD-10-CM

## 2013-10-14 DIAGNOSIS — Z8041 Family history of malignant neoplasm of ovary: Secondary | ICD-10-CM

## 2013-10-14 DIAGNOSIS — Z01419 Encounter for gynecological examination (general) (routine) without abnormal findings: Secondary | ICD-10-CM

## 2013-10-14 DIAGNOSIS — Z780 Asymptomatic menopausal state: Secondary | ICD-10-CM

## 2013-10-14 NOTE — Progress Notes (Signed)
NAJLA AUGHENBAUGH June 15, 1955 [id number]   History:    58 y.o.  for annual gyn exam with no complaints today. Patient is menopausal with no vasomotor symptoms.She has history of hypertension hypercholesterolemia and is being followed by Dr.LaLonde who will be seen in the next few weeks her blood work.Patient with history of colon polyp in 2008 and followup colonoscopy in 2011 was negative. Dr. Collene Mares offered seen in the past for anemia. Her last bone density study was normal in 2013.Several years ago she did have history of a right breast biopsy which was negative. Her mother had ovarian cancer and for this reason every year we do an ultrasound along with a CA 125 patient fully aware of its limitations. No past history of abnormal Pap smears.   Past medical history,surgical history, family history and social history were all reviewed and documented in the EPIC chart.  Gynecologic History No LMP recorded. Patient is postmenopausal. Contraception: post menopausal status Last Pap: 2013. Results were: normal Last mammogram: 2015. Results were: normal  Obstetric History OB History  Gravida Para Term Preterm AB SAB TAB Ectopic Multiple Living  3 3        3     # Outcome Date GA Lbr Len/2nd Weight Sex Delivery Anes PTL Lv  3 PAR           2 PAR           1 PAR                ROS: A ROS was performed and pertinent positives and negatives are included in the history.  GENERAL: No fevers or chills. HEENT: No change in vision, no earache, sore throat or sinus congestion. NECK: No pain or stiffness. CARDIOVASCULAR: No chest pain or pressure. No palpitations. PULMONARY: No shortness of breath, cough or wheeze. GASTROINTESTINAL: No abdominal pain, nausea, vomiting or diarrhea, melena or bright red blood per rectum. GENITOURINARY: No urinary frequency, urgency, hesitancy or dysuria. MUSCULOSKELETAL: No joint or muscle pain, no back pain, no recent trauma. DERMATOLOGIC: No rash, no itching, no lesions.  ENDOCRINE: No polyuria, polydipsia, no heat or cold intolerance. No recent change in weight. HEMATOLOGICAL: No anemia or easy bruising or bleeding. NEUROLOGIC: No headache, seizures, numbness, tingling or weakness. PSYCHIATRIC: No depression, no loss of interest in normal activity or change in sleep pattern.     Exam: chaperone present  BP 128/82  Ht 5\' 8"  (1.727 m)  Wt 156 lb (70.761 kg)  BMI 23.73 kg/m2  Body mass index is 23.73 kg/(m^2).  General appearance : Well developed well nourished female. No acute distress HEENT: Neck supple, trachea midline, no carotid bruits, no thyroidmegaly Lungs: Clear to auscultation, no rhonchi or wheezes, or rib retractions  Heart: Regular rate and rhythm, no murmurs or gallops Breast:Examined in sitting and supine position were symmetrical in appearance, no palpable masses or tenderness,  no skin retraction, no nipple inversion, no nipple discharge, no skin discoloration, no axillary or supraclavicular lymphadenopathy Abdomen: no palpable masses or tenderness, no rebound or guarding Extremities: no edema or skin discoloration or tenderness  Pelvic:  Bartholin, Urethra, Skene Glands: Within normal limits             Vagina: No gross lesions or discharge  Cervix: No gross lesions or discharge  Uterus  anteverted, normal size, shape and consistency, non-tender and mobile  Adnexa  Without masses or tenderness  Anus and perineum  normal   Rectovaginal  normal sphincter tone without palpated  masses or tenderness             Hemoccult cards provided     Assessment/Plan:  58 y.o. female for annual exam with family history of ovarian cancer (mother). As part of her angle screening for ovarian cancer she will be scheduled for an ultrasound in the next few weeks. We will draw a CA 125 today. Patient fully aware of its limitations. Patient also will schedule a bone density study. We discussed importance of calcium vitamin D and regular exercise for  osteoporosis prevention. We discussed importance of monthly self breast examination. She was provided with fecal Hemoccult cards to submit to the office at a later time. Her PCP will be drawn her blood work.  Note: This dictation was prepared with  Dragon/digital dictation along withSmart phrase technology. Any transcriptional errors that result from this process are unintentional.   Terrance Mass MD, 10:10 AM 10/14/2013

## 2013-10-15 LAB — CA 125: CA 125: 24 U/mL (ref <35)

## 2013-11-04 ENCOUNTER — Other Ambulatory Visit: Payer: Self-pay | Admitting: Gynecology

## 2013-11-04 DIAGNOSIS — Z13820 Encounter for screening for osteoporosis: Secondary | ICD-10-CM

## 2013-11-06 ENCOUNTER — Ambulatory Visit (INDEPENDENT_AMBULATORY_CARE_PROVIDER_SITE_OTHER): Payer: BC Managed Care – PPO | Admitting: Medical

## 2013-11-06 ENCOUNTER — Encounter: Payer: Self-pay | Admitting: Medical

## 2013-11-06 VITALS — BP 110/70 | HR 78 | Temp 98.1°F | Resp 15 | Wt 156.0 lb

## 2013-11-06 DIAGNOSIS — I10 Essential (primary) hypertension: Secondary | ICD-10-CM

## 2013-11-06 DIAGNOSIS — B351 Tinea unguium: Secondary | ICD-10-CM

## 2013-11-06 DIAGNOSIS — E785 Hyperlipidemia, unspecified: Secondary | ICD-10-CM

## 2013-11-06 DIAGNOSIS — Z23 Encounter for immunization: Secondary | ICD-10-CM

## 2013-11-06 LAB — BASIC METABOLIC PANEL
BUN: 12 mg/dL (ref 6–23)
CALCIUM: 9.3 mg/dL (ref 8.4–10.5)
CO2: 28 mEq/L (ref 19–32)
CREATININE: 0.81 mg/dL (ref 0.50–1.10)
Chloride: 105 mEq/L (ref 96–112)
GLUCOSE: 83 mg/dL (ref 70–99)
Potassium: 5.4 mEq/L — ABNORMAL HIGH (ref 3.5–5.3)
Sodium: 139 mEq/L (ref 135–145)

## 2013-11-06 LAB — CBC
HCT: 36.9 % (ref 36.0–46.0)
Hemoglobin: 12.4 g/dL (ref 12.0–15.0)
MCH: 28.3 pg (ref 26.0–34.0)
MCHC: 33.6 g/dL (ref 30.0–36.0)
MCV: 84.2 fL (ref 78.0–100.0)
PLATELETS: 274 10*3/uL (ref 150–400)
RBC: 4.38 MIL/uL (ref 3.87–5.11)
RDW: 14.2 % (ref 11.5–15.5)
WBC: 4.9 10*3/uL (ref 4.0–10.5)

## 2013-11-06 LAB — CK: Total CK: 113 U/L (ref 7–177)

## 2013-11-06 NOTE — Progress Notes (Signed)
Subjective: Here for routine f/u.  Here for follow-up of hypertension. She is exercising - walking, step aerobics, weights, most days per week, but on average 5 days per week.  She  is adherent to a low-salt diet. Not checking BP at home.  Cardiac symptoms: none.  Patient denies: chest pain, claudication, dyspnea, fatigue and palpitations. Cardiovascular risk factors: dyslipidemia and hypertension. Use of agents associated with hypertension: none. History of target organ damage: none.  Sherri Rangel is here for follow up of elevated lipids. Compliance with treatment has been excellent.  Taking Zocor 10mg  daily, Aspirin 81mg  daily.  Patient denies muscle pain associated with female medications.  Having some bruising she attributes to aspirin.  She would like a flu vaccine today.   Onychomycosis - not currently taking anything.   Since last visit has seen gynecology for routine care.    Dermatology - still plans to go soon for routine surveillance.  ROS as in subjective   Objective: Filed Vitals:   11/06/13 0827  BP: 110/70  Pulse: 78  Temp: 98.1 F (36.7 C)  Resp: 15    General appearance: alert, no distress, WD/WN Neck: supple, no lymphadenopathy, no thyromegaly, no masses Heart: RRR, normal S1, S2, no murmurs Lungs: CTA bilaterally, no wheezes, rhonchi, or rales Abdomen: +bs, soft, non tender, non distended, no masses, no hepatomegaly, no splenomegaly Pulses: 2+ symmetric, upper and lower extremities, normal cap refill Ext: no edema   Assessment:  Encounter Diagnoses  Name Primary?  . Hyperlipidemia Yes  . Essential hypertension, benign   . Onychomycosis   . Need for prophylactic vaccination and inoculation against influenza      Plan: Hyperlipidemia-continue same medication, cholesterol have been at goal the last several years, last lipid profile reviewed from January 2015  Hypertension-controlled on current medication, continue same medication, BMET lab today   She can stop Aspirin given she is low risk, and having bruising.   Onychomycosis - will see dermatology as Lamisil and topicals didn't quite get the job done.  She also plans to see dermatology for routine surveillance in general soon.  Counseled on the influenza virus vaccine.  Vaccine information sheet given.  Influenza vaccine given after consent obtained.  F/u pending labs.

## 2013-11-06 NOTE — Addendum Note (Signed)
Addended by: Louie Bun on: 11/06/2013 09:48 AM   Modules accepted: Orders

## 2013-11-07 ENCOUNTER — Other Ambulatory Visit: Payer: BC Managed Care – PPO

## 2013-11-07 ENCOUNTER — Ambulatory Visit: Payer: BC Managed Care – PPO | Admitting: Gynecology

## 2013-11-10 ENCOUNTER — Encounter: Payer: Self-pay | Admitting: Gynecology

## 2013-11-10 ENCOUNTER — Ambulatory Visit (INDEPENDENT_AMBULATORY_CARE_PROVIDER_SITE_OTHER): Payer: BC Managed Care – PPO

## 2013-11-10 ENCOUNTER — Ambulatory Visit (INDEPENDENT_AMBULATORY_CARE_PROVIDER_SITE_OTHER): Payer: BC Managed Care – PPO | Admitting: Gynecology

## 2013-11-10 ENCOUNTER — Other Ambulatory Visit: Payer: Self-pay | Admitting: Gynecology

## 2013-11-10 VITALS — BP 126/82

## 2013-11-10 DIAGNOSIS — Z8041 Family history of malignant neoplasm of ovary: Secondary | ICD-10-CM

## 2013-11-10 DIAGNOSIS — N83339 Acquired atrophy of ovary and fallopian tube, unspecified side: Secondary | ICD-10-CM

## 2013-11-10 NOTE — Progress Notes (Signed)
    Patient is a 58 year old with family history of ovarian cancer and has been screened on an annual basis along with a CA 125. Despite these measures the patient has been informed that there limitations to this annual screening. She is otherwise asymptomatic today.  Ultrasound today: Uterus measuring 5.3 x 5.2 x 2.8 cm with endometrial strut was 0.7 mm. Right and left her were normal. There was no fluid in the cul-de-sac. No apparent masses were noted on either adnexa.  CA 125 was normal with a value of 24  Assessment/plan: Patient with mother who had rapidly developing ovarian cancer who succumbed to the disease. Ultrasound and CA 125 normal today. Computer screen on annual basis. Patient were to contact the office of any symptoms were to develop. She'll also continue to follow with her PCP as well.

## 2013-11-20 DIAGNOSIS — Z9289 Personal history of other medical treatment: Secondary | ICD-10-CM

## 2013-11-20 HISTORY — DX: Personal history of other medical treatment: Z92.89

## 2013-11-25 ENCOUNTER — Ambulatory Visit (INDEPENDENT_AMBULATORY_CARE_PROVIDER_SITE_OTHER): Payer: BC Managed Care – PPO

## 2013-11-25 ENCOUNTER — Other Ambulatory Visit: Payer: Self-pay | Admitting: Gynecology

## 2013-11-25 DIAGNOSIS — Z13828 Encounter for screening for other musculoskeletal disorder: Secondary | ICD-10-CM

## 2013-11-25 DIAGNOSIS — Z13820 Encounter for screening for osteoporosis: Secondary | ICD-10-CM

## 2013-11-25 DIAGNOSIS — M899 Disorder of bone, unspecified: Secondary | ICD-10-CM

## 2013-12-22 ENCOUNTER — Encounter: Payer: Self-pay | Admitting: Gynecology

## 2013-12-26 ENCOUNTER — Other Ambulatory Visit: Payer: BC Managed Care – PPO | Admitting: Anesthesiology

## 2013-12-26 DIAGNOSIS — Z1211 Encounter for screening for malignant neoplasm of colon: Secondary | ICD-10-CM

## 2014-04-22 ENCOUNTER — Other Ambulatory Visit: Payer: Self-pay | Admitting: Medical

## 2014-07-16 ENCOUNTER — Other Ambulatory Visit: Payer: Self-pay | Admitting: Medical

## 2014-09-08 ENCOUNTER — Encounter: Payer: Self-pay | Admitting: Medical

## 2014-09-08 ENCOUNTER — Ambulatory Visit (INDEPENDENT_AMBULATORY_CARE_PROVIDER_SITE_OTHER): Payer: BC Managed Care – PPO | Admitting: Medical

## 2014-09-08 VITALS — BP 118/80 | HR 68 | Temp 98.1°F | Resp 14 | Wt 159.0 lb

## 2014-09-08 DIAGNOSIS — Z0000 Encounter for general adult medical examination without abnormal findings: Secondary | ICD-10-CM

## 2014-09-08 DIAGNOSIS — H6503 Acute serous otitis media, bilateral: Secondary | ICD-10-CM

## 2014-09-08 DIAGNOSIS — I10 Essential (primary) hypertension: Secondary | ICD-10-CM | POA: Diagnosis not present

## 2014-09-08 DIAGNOSIS — E785 Hyperlipidemia, unspecified: Secondary | ICD-10-CM

## 2014-09-08 LAB — COMPREHENSIVE METABOLIC PANEL
ALK PHOS: 78 U/L (ref 39–117)
ALT: 16 U/L (ref 0–35)
AST: 21 U/L (ref 0–37)
Albumin: 4.1 g/dL (ref 3.5–5.2)
BUN: 14 mg/dL (ref 6–23)
CALCIUM: 9.1 mg/dL (ref 8.4–10.5)
CO2: 26 mEq/L (ref 19–32)
CREATININE: 0.72 mg/dL (ref 0.50–1.10)
Chloride: 104 mEq/L (ref 96–112)
GLUCOSE: 82 mg/dL (ref 70–99)
POTASSIUM: 4.1 meq/L (ref 3.5–5.3)
SODIUM: 140 meq/L (ref 135–145)
Total Bilirubin: 0.5 mg/dL (ref 0.2–1.2)
Total Protein: 6.9 g/dL (ref 6.0–8.3)

## 2014-09-08 LAB — CBC
HEMATOCRIT: 38.8 % (ref 36.0–46.0)
Hemoglobin: 12.8 g/dL (ref 12.0–15.0)
MCH: 28.9 pg (ref 26.0–34.0)
MCHC: 33 g/dL (ref 30.0–36.0)
MCV: 87.6 fL (ref 78.0–100.0)
MPV: 9.5 fL (ref 8.6–12.4)
Platelets: 255 10*3/uL (ref 150–400)
RBC: 4.43 MIL/uL (ref 3.87–5.11)
RDW: 14.1 % (ref 11.5–15.5)
WBC: 6 10*3/uL (ref 4.0–10.5)

## 2014-09-08 LAB — LIPID PANEL
Cholesterol: 168 mg/dL (ref 0–200)
HDL: 51 mg/dL (ref >=46)
LDL Cholesterol: 104 mg/dL — ABNORMAL HIGH (ref 0–99)
Total CHOL/HDL Ratio: 3.3 Ratio
Triglycerides: 66 mg/dL (ref <150)
VLDL: 13 mg/dL (ref 0–40)

## 2014-09-08 MED ORDER — LISINOPRIL 5 MG PO TABS: 5.0000 mg | ORAL_TABLET | Freq: Every day | ORAL | Status: DC

## 2014-09-08 MED ORDER — AMOXICILLIN 875 MG PO TABS: 875.0000 mg | ORAL_TABLET | Freq: Two times a day (BID) | ORAL | Status: DC

## 2014-09-08 MED ORDER — ASPIRIN 81 MG PO TABS: 81.0000 mg | ORAL_TABLET | Freq: Every day | ORAL | Status: DC

## 2014-09-08 MED ORDER — SIMVASTATIN 10 MG PO TABS: 10.0000 mg | ORAL_TABLET | Freq: Every day | ORAL | Status: DC

## 2014-09-08 NOTE — Patient Instructions (Signed)
Encounter Diagnoses  Name Primary?  . Encounter for health maintenance examination in adult Yes  . Essential hypertension   . Hyperlipidemia   . Bilateral acute serous otitis media, recurrence not specified     Recommendations:  Begin amoxicillin for ear infection, you can also use benadryl or mucinex OTC  See your eye doctor yearly for routine vision care.  See your gynecologist yearly for routine gynecological care.  See your dentist yearly for routine dental care including hygiene visits twice yearly.  Schedule your repeat colonoscopy  Continue to exercise regularly, eat healthy  Get a flu shot in the next 2 months when they become available  Continue your current medications

## 2014-09-08 NOTE — Progress Notes (Signed)
Subjective:   HPI  Sherri Rangel is a 59 y.o. female who presents for a complete physical.  Medical care team/other doctors includes:  Dr. Nevada Crane, dermatology  Dr. Uvaldo Rising, gynecology  Dr. Earlean Shawl, GI Dorothea Ogle, PA-C here with Dr. Redmond School   Preventative care: Last physical or labs: year ago Sees dentist yearly: Yes Last tetanus vaccine, TD or Tdap: up to date   Concerns: Here for med refills on BP and cholesterol medication, doing fine on medication without c/o  Reviewed their medical, surgical, family, social, medication, and allergy history and updated chart as appropriate.  Past Medical History  Diagnosis Date  . Hyperlipidemia   . Iron deficiency 06/2009    negative EGD and colonoscopy 08/2009  . BCC (basal cell carcinoma)     right eye, LLE  . Colon polyp 08/2009    WNL, internal hemorrhoid (Dr. Earlean Shawl); also had colonoscopy 2008 with Dr. Carlean Purl  . Hypertension   . Menopausal state   . Vaginal atrophy   . Encounter for routine gynecological examination     sees Dr. Uvaldo Rising  . H/O bone density study 11/2013    normal  . Anemia     normal CBC 2015    Past Surgical History  Procedure Laterality Date  . Skin tags    . Colonoscopy  2011    due back 2016, Dr. Earlean Shawl  . Breast surgery      RIGHT BREAST BIOPSY/FCD    History   Social History  . Marital Status: Married    Spouse Name: N/A  . Number of Children: N/A  . Years of Education: N/A   Occupational History  . music teacher at [employer] History Main Topics  . Smoking status: Never Smoker   . Smokeless tobacco: Never Used  . Alcohol Use: 0.5 oz/week    1 Standard drinks or equivalent per week     Comment: 4 drinks in a month  . Drug Use: No  . Sexual Activity: Yes    Birth Control/ Protection: Post-menopausal     Comment: exercise - step aerobics, some weights.  Teaches elementary music.  Married.   Other Topics Concern  . 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   Has 3 children, 2 grandchildren 2yo [place] and Caroline 61yo, that live in [place], [place].  Exercise - does group exercise, step aerobics, weights, eating healthy    Family History  Problem Relation Age of Onset  . Cancer Mother 69    ovarian cancer  . Hypertension Mother   . Osteoporosis Mother   . Cancer Father     esophageal  . Alcohol abuse Father   . Hyperlipidemia Father   . Diabetes Maternal Aunt   . Diabetes Cousin   . Diabetes Cousin   . Cancer Brother     MULTIPLE MYELOMA  . Heart disease Paternal Grandfather      Current outpatient prescriptions:  .  aspirin 81 MG tablet, Take 1 tablet (81 mg total) by mouth daily., Disp: 90 tablet, Rfl: 3 .  Calcium Carbonate-Vitamin D (CALCIUM 600+D) 600-400 MG-UNIT per tablet, Take 1 tablet by mouth daily.  , Disp: , Rfl:  .  co-enzyme Q-10 30 MG capsule, Take 30 mg by mouth 3 (three) times daily., Disp: , Rfl:  .  glucosamine-chondroitin 500-400 MG tablet, Take 1 tablet by mouth 3 (three)  times daily., Disp: , Rfl:  .  Krill Oil 300 MG CAPS, Take 1 capsule by mouth daily.  , Disp: , Rfl:  .  lisinopril (PRINIVIL,ZESTRIL) 5 MG tablet, Take 1 tablet (5 mg total) by mouth daily., Disp: 90 tablet, Rfl: 3 .  Multiple Vitamin (MULTIVITAMIN) tablet, Take 1 tablet by mouth daily.  , Disp: , Rfl:  .  simvastatin (ZOCOR) 10 MG tablet, Take 1 tablet (10 mg total) by mouth daily at 6 PM., Disp: 90 tablet, Rfl: 3 .  amoxicillin (AMOXIL) 875 MG tablet, Take 1 tablet (875 mg total) by mouth 2 (two) times daily., Disp: 20 tablet, Rfl: 0 .  cycloSPORINE (RESTASIS) 0.05 % ophthalmic emulsion, 1 drop 2 (two) times daily., Disp: , Rfl:   No Known Allergies  Review of Systems Constitutional: -fever, -chills, -sweats, -unexpected weight change, -decreased appetite, -fatigue Allergy: -sneezing, -itching, -congestion Dermatology:  -changing moles, --rash, -lumps ENT: -runny nose, -ear pain, -sore throat, -hoarseness, -sinus pain, -teeth pain, - ringing in ears, -hearing loss, -nosebleeds Cardiology: -chest pain, -palpitations, -swelling, -difficulty breathing when lying flat, -waking up short of breath Respiratory: -cough, -shortness of breath, -difficulty breathing with exercise or exertion, -wheezing, -coughing up blood Gastroenterology: -abdominal pain, -nausea, -vomiting, -diarrhea, -constipation, -blood in stool, -changes in bowel movement, -difficulty swallowing or eating Hematology: -bleeding, -bruising  Musculoskeletal: -joint aches, -muscle aches, -joint swelling, -back pain, -neck pain, -cramping, -changes in gait Ophthalmology: denies vision changes, eye redness, itching, discharge Urology: -burning with urination, -difficulty urinating, -blood in urine, -urinary frequency, -urgency, -incontinence Neurology: -headache, -weakness, -tingling, -numbness, -memory loss, -falls, -dizziness Psychology: -depressed mood, -agitation, -sleep problems     Objective:   Physical Exam  BP 118/80 mmHg  Pulse 68  Temp(Src) 98.1 F (36.7 C) (Oral)  Resp 14  Wt 159 lb (72.122 kg)  General appearance: alert, no distress, WD/WN, white female Skin:scattered macules, no worrisome lesions HEENT: normocephalic, conjunctiva/corneas normal, sclerae anicteric, PERRLA, EOMi, nares patent, TMs bilat with serous effusions, mild erythema, pharynx with post nasal drainage Oral cavity: MMM, tongue normal, teeth in good repair Neck: supple, no lymphadenopathy, no thyromegaly, no masses, normal ROM, no bruits Chest: non tender, normal shape and expansion Heart: RRR, normal S1, S2, no murmurs Lungs: CTA bilaterally, no wheezes, rhonchi, or rales Abdomen: +bs, soft, non tender, non distended, no masses, no hepatomegaly, no splenomegaly, no bruits Back: non tender, normal ROM, no scoliosis Musculoskeletal: upper extremities non tender,  no obvious deformity, normal ROM throughout, lower extremities non tender, no obvious deformity, normal ROM throughout Extremities: no edema, no cyanosis, no clubbing Pulses: 2+ symmetric, upper and lower extremities, normal cap refill Neurological: alert, oriented x 3, CN2-12 intact, strength normal upper extremities and lower extremities, sensation normal throughout, DTRs 2+ throughout, no cerebellar signs, gait normal Psychiatric: normal affect, behavior normal, pleasant  Breast/gy/rectal: deferred to gyn   Assessment and Plan :    Encounter Diagnoses  Name Primary?  . Encounter for health maintenance examination in adult Yes  . Essential hypertension   . Hyperlipidemia   . Bilateral acute serous otitis media, recurrence not specified     Physical exam - discussed healthy lifestyle, diet, exercise, preventative care, vaccinations, and addressed their concerns.   See your dentist yearly for routine dental care including hygiene visits twice yearly. See your eye doctor yearly for routine vision care. See your gynecologist yearly for routine gynecological care. She is due back at this time for repeat colonoscopy and will make that appt Routine labs today.  Reviewed bone density normal scan from 11/2013, reviewed other recent chart records  Vaccinations: Advised yearly flu shot Up to date otherwise  Other concerns today: HTN - c/t current medication Hyperlipidemia - c/t current medication Otitis media - advised supportive OTC remedies for congestion, begin Amoxicillin, hydrate well, f/u if not improving.  Follow up pending labs

## 2014-09-09 ENCOUNTER — Other Ambulatory Visit: Payer: Self-pay | Admitting: Medical

## 2014-09-09 DIAGNOSIS — E785 Hyperlipidemia, unspecified: Secondary | ICD-10-CM

## 2014-09-09 DIAGNOSIS — I10 Essential (primary) hypertension: Secondary | ICD-10-CM

## 2014-09-09 LAB — VITAMIN D 25 HYDROXY (VIT D DEFICIENCY, FRACTURES): Vit D, 25-Hydroxy: 47 ng/mL (ref 30–100)

## 2014-09-11 ENCOUNTER — Other Ambulatory Visit: Payer: Self-pay | Admitting: Family Medicine

## 2014-09-11 DIAGNOSIS — Z1211 Encounter for screening for malignant neoplasm of colon: Secondary | ICD-10-CM

## 2014-09-14 ENCOUNTER — Other Ambulatory Visit: Payer: Self-pay | Admitting: Medical

## 2014-09-14 DIAGNOSIS — E785 Hyperlipidemia, unspecified: Secondary | ICD-10-CM

## 2014-09-21 HISTORY — PX: COLONOSCOPY: SHX174

## 2014-10-13 LAB — HM COLONOSCOPY

## 2014-10-16 ENCOUNTER — Other Ambulatory Visit (HOSPITAL_COMMUNITY)
Admission: RE | Admit: 2014-10-16 | Discharge: 2014-10-16 | Disposition: A | Discharge status: Home / Self Care | Payer: BC Managed Care – PPO | Source: Ambulatory Visit | Attending: Gynecology | Admitting: Gynecology

## 2014-10-16 ENCOUNTER — Encounter: Payer: Self-pay | Admitting: Gynecology

## 2014-10-16 ENCOUNTER — Ambulatory Visit (INDEPENDENT_AMBULATORY_CARE_PROVIDER_SITE_OTHER): Payer: BC Managed Care – PPO | Admitting: Gynecology

## 2014-10-16 VITALS — BP 120/70 | Ht 68.0 in | Wt 158.0 lb

## 2014-10-16 DIAGNOSIS — Z1151 Encounter for screening for human papillomavirus (HPV): Secondary | ICD-10-CM | POA: Diagnosis not present

## 2014-10-16 DIAGNOSIS — Z8041 Family history of malignant neoplasm of ovary: Secondary | ICD-10-CM | POA: Diagnosis not present

## 2014-10-16 DIAGNOSIS — Z01419 Encounter for gynecological examination (general) (routine) without abnormal findings: Secondary | ICD-10-CM | POA: Diagnosis not present

## 2014-10-16 NOTE — Addendum Note (Signed)
Addended by: Thurnell Garbe A on: 10/16/2014 09:19 AM   Modules accepted: Orders, SmartSet

## 2014-10-16 NOTE — Progress Notes (Signed)
Sherri Rangel [birth year] [id number]   History:    59 y.o.  for annual gyn exam with no complaints today. Patient is menopausal with no vasomotor symptoms on no hormone replacement therapy.She has history of hypertension hypercholesterolemia and is being followed by Dr.LaLonde who recently did her blood work. Patient with history of benign colon polyps removed in 2008 follow-up colonoscopy 2011 in 2060 reported to be normal. Patient's mother had an aggressive ovarian cancer and for this reason she is having a pelvic ultrasound every year along with a CA 125 although patient is aware of its limitations. Patient with no past history of any abnormal Pap smears. Patient with normal bone density study in 2015.  Past medical history,surgical history, family history and social history were all reviewed and documented in the EPIC chart.  Gynecologic History No LMP recorded. Patient is postmenopausal. Contraception: post menopausal status Last Pap: 2013. Results were: normal Last mammogram: 2016. Results were: Done yesterday results pending history of dense breasts  Obstetric History OB History  Gravida Para Term Preterm AB SAB TAB Ectopic Multiple Living  3 3        3     # Outcome Date GA Lbr Len/2nd Weight Sex Delivery Anes PTL Lv  3 Para           2 Para           1 Para                ROS: A ROS was performed and pertinent positives and negatives are included in the history.  GENERAL: No fevers or chills. HEENT: No change in vision, no earache, sore throat or sinus congestion. NECK: No pain or stiffness. CARDIOVASCULAR: No chest pain or pressure. No palpitations. PULMONARY: No shortness of breath, cough or wheeze. GASTROINTESTINAL: No abdominal pain, nausea, vomiting or diarrhea, melena or bright red blood per rectum. GENITOURINARY: No urinary frequency, urgency, hesitancy or dysuria. MUSCULOSKELETAL: No joint or muscle pain, no back pain, no recent trauma. DERMATOLOGIC: No rash, no  itching, no lesions. ENDOCRINE: No polyuria, polydipsia, no heat or cold intolerance. No recent change in weight. HEMATOLOGICAL: No anemia or easy bruising or bleeding. NEUROLOGIC: No headache, seizures, numbness, tingling or weakness. PSYCHIATRIC: No depression, no loss of interest in normal activity or change in sleep pattern.     Exam: chaperone present  BP 120/70 mmHg  Ht 5\' 8"  (1.727 m)  Wt 158 lb (71.668 kg)  BMI 24.03 kg/m2  Body mass index is 24.03 kg/(m^2).  General appearance : Well developed well nourished female. No acute distress HEENT: Eyes: no retinal hemorrhage or exudates,  Neck supple, trachea midline, no carotid bruits, no thyroidmegaly Lungs: Clear to auscultation, no rhonchi or wheezes, or rib retractions  Heart: Regular rate and rhythm, no murmurs or gallops Breast:Examined in sitting and supine position were symmetrical in appearance, no palpable masses or tenderness,  no skin retraction, no nipple inversion, no nipple discharge, no skin discoloration, no axillary or supraclavicular lymphadenopathy Abdomen: no palpable masses or tenderness, no rebound or guarding Extremities: no edema or skin discoloration or tenderness  Pelvic:  Bartholin, Urethra, Skene Glands: Within normal limits             Vagina: No gross lesions or discharge, atrophic changes  Cervix: No gross lesions or discharge  Uterus  anteverted, normal size, shape and consistency, non-tender and mobile  Adnexa  Without masses or tenderness  Anus and perineum  normal   Rectovaginal  normal  sphincter tone without palpated masses or tenderness             Hemoccult colonoscopy this year     Assessment/Plan:  59 y.o. female for annual exam menopausal on no hormone replacement therapy no vasomotor symptoms. PCP has been doing her blood work. Patient with family history of ovarian cancer will schedule an ultrasound for next week for screening. She will have a CA 125 drawn today. Pap smear was done  today. We discussed importance of calcium vitamin D and regular exercise for osteoporosis prevention. She will need a bone density study next year.   Terrance Mass MD, 8:59 AM 10/16/2014

## 2014-10-17 LAB — CA 125: CA 125: 24 U/mL (ref <35)

## 2014-10-20 ENCOUNTER — Other Ambulatory Visit: Payer: Self-pay | Admitting: Gynecology

## 2014-10-20 DIAGNOSIS — Z8041 Family history of malignant neoplasm of ovary: Secondary | ICD-10-CM

## 2014-10-20 LAB — CYTOLOGY - PAP

## 2014-10-23 ENCOUNTER — Other Ambulatory Visit: Payer: Self-pay | Admitting: Medical

## 2014-10-23 NOTE — Telephone Encounter (Signed)
Pt picked up prescription.

## 2014-11-05 ENCOUNTER — Encounter: Payer: Self-pay | Admitting: Gynecology

## 2014-11-05 ENCOUNTER — Ambulatory Visit (INDEPENDENT_AMBULATORY_CARE_PROVIDER_SITE_OTHER): Payer: BC Managed Care – PPO | Admitting: Gynecology

## 2014-11-05 ENCOUNTER — Ambulatory Visit (INDEPENDENT_AMBULATORY_CARE_PROVIDER_SITE_OTHER): Payer: BC Managed Care – PPO

## 2014-11-05 ENCOUNTER — Other Ambulatory Visit: Payer: Self-pay | Admitting: Gynecology

## 2014-11-05 VITALS — BP 134/80

## 2014-11-05 DIAGNOSIS — N8300 Follicular cyst of ovary, unspecified side: Secondary | ICD-10-CM

## 2014-11-05 DIAGNOSIS — Z8041 Family history of malignant neoplasm of ovary: Secondary | ICD-10-CM | POA: Diagnosis not present

## 2014-11-05 NOTE — Progress Notes (Signed)
   Patient presented to the office today to discuss her ultrasound. Patient's mother had a history of ovarian cancer in patients very much concern. We are doing yearly ultrasounds as well as CA 125. Her CA 125 last year and of this year was 24. Patient has been asymptomatic otherwise. She had a complete gynecological examination 10/16/2014. Her recent Pap smear, and mammogram were normal. She is not due for bone density study until next year.  Ultrasound today: Uterus measured 6.3 x 5.0 x 2.4 cm with endometrial stripe of 1 mm. A right thick wall collapsed follicle measured 7 x 9 x 5 mm was noted. No fluid in the cul-de-sac. Left ovary normal.   Assessment/plan: Patient with family history of ovarian cancer negative ultrasound small follicle insignificant. Normal CA 125. Patient scheduled to return back to the office next year for annual exam or when necessary.

## 2014-12-10 ENCOUNTER — Encounter: Payer: Self-pay | Admitting: Family Medicine

## 2014-12-10 ENCOUNTER — Ambulatory Visit (INDEPENDENT_AMBULATORY_CARE_PROVIDER_SITE_OTHER): Payer: BC Managed Care – PPO | Admitting: Family Medicine

## 2014-12-10 VITALS — BP 140/86 | HR 60 | Temp 98.2°F | Wt 157.0 lb

## 2014-12-10 DIAGNOSIS — J040 Acute laryngitis: Secondary | ICD-10-CM | POA: Diagnosis not present

## 2014-12-10 DIAGNOSIS — R05 Cough: Secondary | ICD-10-CM | POA: Diagnosis not present

## 2014-12-10 DIAGNOSIS — K120 Recurrent oral aphthae: Secondary | ICD-10-CM | POA: Diagnosis not present

## 2014-12-10 DIAGNOSIS — R059 Cough, unspecified: Secondary | ICD-10-CM

## 2014-12-10 MED ORDER — AZITHROMYCIN 250 MG PO TABS: ORAL_TABLET | ORAL | Status: DC

## 2014-12-10 MED ORDER — BENZONATATE 200 MG PO CAPS: 200.0000 mg | ORAL_CAPSULE | Freq: Two times a day (BID) | ORAL | Status: DC | PRN

## 2014-12-10 NOTE — Patient Instructions (Signed)
Laryngitis  Laryngitis is inflammation of your vocal cords. This causes hoarseness, coughing, loss of voice, sore throat, or a dry throat. Your vocal cords are two bands of muscles that are found in your throat. When you speak, these cords come together and vibrate. These vibrations come out through your mouth as sound. When your vocal cords are inflamed, your voice sounds different.  Laryngitis can be temporary (acute) or long-term (chronic). Most cases of acute laryngitis improve with time. Chronic laryngitis is laryngitis that lasts for more than three weeks.  CAUSES  Acute laryngitis may be caused by:   A viral infection.   Lots of talking, yelling, or singing. This is also called vocal strain.   Bacterial infections.  Chronic laryngitis may be caused by:   Vocal strain.   Injury to your vocal cords.   Acid reflux (gastroesophageal reflux disease or GERD).   Allergies.   Sinus infection.   Smoking.   Alcohol abuse.   Breathing in chemicals or dust.   Growths on the vocal cords.  RISK FACTORS  Risk factors for laryngitis include:   Smoking.   Alcohol abuse.   Having allergies.  SIGNS AND SYMPTOMS  Symptoms of laryngitis may include:   Low, hoarse voice.   Loss of voice.   Dry cough.   Sore throat.   Stuffy nose.  DIAGNOSIS  Laryngitis may be diagnosed by:   Physical exam.   Throat culture.   Blood test.   Laryngoscopy. This procedure allows your health care provider to look at your vocal cords with a mirror or viewing tube.  TREATMENT  Treatment for laryngitis depends on what is causing it. Usually, treatment involves resting your voice and using medicines to soothe your throat. However, if your laryngitis is caused by a bacterial infection, you may need to take antibiotic medicine. If your laryngitis is caused by a growth, you may need to have a procedure to remove it.  HOME CARE INSTRUCTIONS   Drink enough fluid to keep your urine clear or pale yellow.   Breathe in moist air. Use a  humidifier if you live in a dry climate.   Take medicines only as directed by your health care provider.   If you were prescribed an antibiotic medicine, finish it all even if you start to feel better.   Do not smoke cigarettes or electronic cigarettes. If you need help quitting, ask your health care provider.   Talk as little as possible. Also avoid whispering, which can cause vocal strain.   Write instead of talking. Do this until your voice is back to normal.  SEEK MEDICAL CARE IF:   You have a fever.   You have increasing pain.   You have difficulty swallowing.  SEEK IMMEDIATE MEDICAL CARE IF:   You cough up blood.   You have trouble breathing.     This information is not intended to replace advice given to you by your health care provider. Make sure you discuss any questions you have with your health care provider.     Document Released: 02/06/2005 Document Revised: 02/27/2014 Document Reviewed: 07/22/2013  Elsevier Interactive Patient Education 2016 [employer]

## 2014-12-10 NOTE — Progress Notes (Signed)
   Subjective:    Patient ID: Sherri Rangel, female    DOB: 07-19-1955, 59 y.o.   MRN: [id number]  HPI She is here with upper respiratory symptoms that started 13 days and began with runny nose, drainage, dry cough, scratchy throat and turned into barking cough, hoarseness and has gotten worse. 4 days ago she noticed that she occasionally feels short of breath of breath with activities and chest feels tight with coughing.  Also complains of loss of taste and soreness to inside of her mouth, particularly under her tongue.  Denies fever, headache, orthopnea, chills, nausea, vomiting. No history of pneumonia or lung disease.  Does not smoke. 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  Tried Claritin first week and Mucinex with no relief.   Reviewed allergies, medications, past medical and social history.   Review of Systems Pertinent positives and negatives in the history of present illness.    Objective:   Physical Exam Alert and in no distress. No sinus tenderness. Nares normal. Tympanic membranes and canals are normal. Pharyngeal area is normal. Small white round sublingual shallow lesion noted, no other lesions to mouth or tongue.  Neck is supple without adenopathy.  Cardiac exam shows a regular sinus rhythm without murmurs or gallops. Lungs are clear to auscultation. Skin warm and dry. Extremities without edema.       Assessment & Plan:  Laryngitis  Cough  Oral aphthous ulcer  Recommend symptomatic treatment and Tessalon prescribed for cough. Z-pak prescribed. Discussed option of ordering chest XR and patient and I agreed that we would hold off on this for now. Recommend that if she develops chest pain, worsening shortness of breath, high fever, or her cough becomes productive that she let me know.  She will let me know if she is not back to baseline after completing the antibiotic. She can try anbesol otc for painful mouth ulcer or rinsing with salt water.

## 2014-12-18 ENCOUNTER — Ambulatory Visit
Admission: RE | Admit: 2014-12-18 | Discharge: 2014-12-18 | Disposition: A | Discharge status: Home / Self Care | Payer: BC Managed Care – PPO | Source: Ambulatory Visit | Attending: Family Medicine | Admitting: Family Medicine

## 2014-12-18 ENCOUNTER — Telehealth: Payer: Self-pay | Admitting: Family Medicine

## 2014-12-18 DIAGNOSIS — R05 Cough: Secondary | ICD-10-CM

## 2014-12-18 DIAGNOSIS — R053 Chronic cough: Secondary | ICD-10-CM

## 2014-12-18 MED ORDER — BENZONATATE 200 MG PO CAPS: 200.0000 mg | ORAL_CAPSULE | Freq: Two times a day (BID) | ORAL | Status: DC | PRN

## 2014-12-18 MED ORDER — ALBUTEROL SULFATE HFA 108 (90 BASE) MCG/ACT IN AERS: 2.0000 | INHALATION_SPRAY | Freq: Four times a day (QID) | RESPIRATORY_TRACT | Status: DC | PRN

## 2014-12-18 NOTE — Telephone Encounter (Signed)
Spoke with patient and her cough continues to be a dry cough with a tickle that she cannot get rid of. States Z-Pak helped some but she continues to have cough. Denies fever, chills. Does not smoke. Does not have underlying allergies. Chest x-ray ordered and albuterol inhaler sent to pharmacy. Instructions for use provided to patient.

## 2014-12-18 NOTE — Telephone Encounter (Signed)
Pt says she did get slightly better after seeing Vickie last week but now starting to get worse again. Voice is back but has bad tickle in chest that keeps making her cough. Can she gets meds before she gets too bad?

## 2014-12-18 NOTE — Telephone Encounter (Signed)
Pt states that she did pick up zpak and it helped. Her voice is ok but still not back to normal. She has chest tingling so it makes her cough. She did take the cough med you prescribe and it does good but if it has still has not gotten rid of cough. She is wondering if another zpak is needed or another med. i told her to check with the pharmacy later on today incase we send something to the pharmacy

## 2014-12-18 NOTE — Telephone Encounter (Signed)
Please call her and ask her if she picked up the Z-Pak that I prescribed

## 2015-02-01 ENCOUNTER — Ambulatory Visit (INDEPENDENT_AMBULATORY_CARE_PROVIDER_SITE_OTHER): Payer: BC Managed Care – PPO | Admitting: Medical

## 2015-02-01 ENCOUNTER — Encounter: Payer: Self-pay | Admitting: Medical

## 2015-02-01 VITALS — BP 130/72 | HR 70 | Temp 98.4°F | Resp 12 | Wt 159.2 lb

## 2015-02-01 DIAGNOSIS — J0110 Acute frontal sinusitis, unspecified: Secondary | ICD-10-CM | POA: Diagnosis not present

## 2015-02-01 MED ORDER — AMOXICILLIN 500 MG PO TABS: ORAL_TABLET | ORAL | Status: DC

## 2015-02-01 NOTE — Progress Notes (Signed)
Subjective: Chief Complaint  Patient presents with  . sinus issues    x1 week been intolerable since last Thursday. headache, face hurts and feels swollen, low grade fever.   Here for 1 week hx/o sinus pressure, nose feels giant, headache, but denies ear pain.  Has post nasal drainage, sore throat mildly.   Has felt low grade fever.   No nausea, no vomiting.  Has some cough.  Has some sneezing, some itchy eyes.   Thinks this started as a cold, but seems to have progressed into sinus infection.  Using some Nyquil, Claritin, mucinex.  No other aggravating or relieving factors. No other complaint.   Past Medical History  Diagnosis Date  . Hyperlipidemia   . Iron deficiency 06/2009    negative EGD and colonoscopy 08/2009  . BCC (basal cell carcinoma)     right eye, LLE  . Colon polyp 08/2009    WNL, internal hemorrhoid (Dr. Earlean Shawl); also had colonoscopy 2008 with Dr. Carlean Purl  . Hypertension   . Menopausal state   . Vaginal atrophy   . Encounter for routine gynecological examination     sees Dr. Uvaldo Rising  . H/O bone density study 11/2013    normal  . Anemia     normal CBC 2015   ROS as in subjective   Objective: BP 130/72 mmHg  Pulse 70  Temp(Src) 98.4 F (36.9 C) (Oral)  Resp 12  Wt 159 lb 3.2 oz (72.213 kg)  General appearance: alert, no distress, WD/WN HEENT: normocephalic, sclerae anicteric, +frontal sinus tenderness, TMs pearly, nares with purulent discharge and erythema, pharynx normal Oral cavity: MMM, no lesions Neck: supple, no lymphadenopathy, no thyromegaly, no masses Lungs: CTA bilaterally, no wheezes, rhonchi, or rales     Assessment: Encounter Diagnosis  Name Primary?  . Acute frontal sinusitis, recurrence not specified Yes     Plan: discussed hydration, rest, can c/t Mucinex, begin Amoxicillin, c/t neti pot, and call/recheck if worse or not improving within 4-5 days.

## 2015-04-26 ENCOUNTER — Encounter: Payer: Self-pay | Admitting: Family Medicine

## 2015-04-26 ENCOUNTER — Ambulatory Visit (INDEPENDENT_AMBULATORY_CARE_PROVIDER_SITE_OTHER): Payer: BC Managed Care – PPO | Admitting: Family Medicine

## 2015-04-26 VITALS — BP 140/80 | HR 72 | Temp 99.0°F | Ht 68.0 in | Wt 161.6 lb

## 2015-04-26 DIAGNOSIS — J069 Acute upper respiratory infection, unspecified: Secondary | ICD-10-CM | POA: Diagnosis not present

## 2015-04-26 DIAGNOSIS — J029 Acute pharyngitis, unspecified: Secondary | ICD-10-CM | POA: Diagnosis not present

## 2015-04-26 LAB — POCT RAPID STREP A (OFFICE): Rapid Strep A Screen: NEGATIVE

## 2015-04-26 NOTE — Progress Notes (Signed)
Chief Complaint  Patient presents with  . Sore Throat    started yesterday with runny nose. Did have a fever last Thursday night-was exposed to someone (drank from her coffee cup) that was positive for strep but was on abx.    4 days ago she started with runny nose, low grade fever, sore throat.  Mucus from nose is clear.  Cough has been dry.  She is complaining of sore throat and cough, scratchy voice. Feels achey and fatigued only when her temperature is up, relieved by Advil.  She started back with claritin when these symptoms started, as well as tessalon if needed for cough, and took Mucinex today.  She has been taking Nyquil at night (only helpful the first night).  +strep exposure last week (person had been on antibiotics for >24 hours at that time). 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  PMH, PSH, SH reviewed  Outpatient Encounter Prescriptions as of 04/26/2015  Medication Sig Note  . aspirin 81 MG tablet Take 1 tablet (81 mg total) by mouth daily.   . benzonatate (TESSALON) 200 MG capsule Take 1 capsule (200 mg total) by mouth 2 (two) times daily as needed for cough.   . Calcium Carbonate-Vitamin D (CALCIUM 600+D) 600-400 MG-UNIT per tablet Take 1 tablet by mouth daily.     Marland Kitchen co-enzyme Q-10 30 MG capsule Take 30 mg by mouth 3 (three) times daily.   . cycloSPORINE (RESTASIS) 0.05 % ophthalmic emulsion 1 drop 2 (two) times daily.   Marland Kitchen guaiFENesin (MUCINEX) 600 MG 12 hr tablet Take 600 mg by mouth 2 (two) times daily. 04/26/2015: Took this morning  . lisinopril (PRINIVIL,ZESTRIL) 5 MG tablet Take 1 tablet (5 mg total) by mouth daily.   Marland Kitchen loratadine (CLARITIN) 10 MG tablet Take 10 mg by mouth daily.   . Multiple Vitamin (MULTIVITAMIN) tablet Take 1 tablet by mouth daily.     . Pseudoeph-Doxylamine-DM-APAP (NYQUIL PO) Take 30 mLs by mouth as needed.   Marland Kitchen RA KRILL OIL PO Take 750 mg by mouth 2 (two) times daily.   . [DISCONTINUED] glucosamine-chondroitin  500-400 MG tablet Take 1 tablet by mouth 3 (three) times daily.   Marland Kitchen albuterol (PROVENTIL HFA;VENTOLIN HFA) 108 (90 BASE) MCG/ACT inhaler Inhale 2 puffs into the lungs every 6 (six) hours as needed for wheezing or shortness of breath. (Patient not taking: Reported on 04/26/2015)   . simvastatin (ZOCOR) 10 MG tablet Take 1 tablet (10 mg total) by mouth daily at 6 PM. (Patient not taking: Reported on 10/16/2014) 04/26/2015: Hasn't taken since the summer  . [DISCONTINUED] amoxicillin (AMOXIL) 500 MG tablet 2 tablets po BID x 10 days    No facility-administered encounter medications on file as of 04/26/2015.   No Known Allergies  ROS: no chest pain, palpitations, nausea, vomiting, diarrhea, bleeding, bruising, rash, urinary complaints.  No joint pains, myalgias. +URI symptoms as per HPI, and lowgrade fevers.  PHYSICAL EXAM: BP 140/80 mmHg  Pulse 72  Temp(Src) 99 F (37.2 C) (Tympanic)  Ht 5\' 8"  (1.727 m)  Wt 161 lb 9.6 oz (73.301 kg)  BMI 24.58 kg/m2  Well appearing female, with scratchy throat, occasional sniffling.  No coughing during visit HEENT: PERRL, EOMI, conjunctiva and sclera are clear.  TM's and EAC's normal bilaterally (narrower on the left).  Nasal mucosa is moderately edematous, +erythema, clear mucus bilaterally. Sinuses nontender. OP: tonsils are normal, not enlarged.  Very mild erythema of posterior throat, on both sides. No lesions, exudates noted  Neck: no lymphadenopathy or mass, no bruit Heart: regular rate and rhythm without murmur Lungs: clear bilaterally Back: no CVA tenderness Skin: normal turgor, no rash Neuro: alert and oriented. Cranial nerves intact normal strength, gait Psych: normal mood, affect, hygiene and grooming  Rapid strep negative   ASSESSMENT/PLAN:  Acute upper respiratory infection  Sore throat - Plan: Rapid Strep A  Supportive measures reviewed, as well as s/sx of infection.   Past due for med check, to schedule with Audelia Acton   I suspect this is a  viral syndrome.  No evidence of bacterial infection on exam. Continue to drink plenty of fluids. Continue to use claritin daily, mucinex twice daily. Consider Delsym syrup (vs benzonatate/tessalon) as needed for coughing. Avoid decongestants due to history of hypertension (you can use sparingly, if monitoring your blood pressure) Return if having persistent/worsening symptoms, high fever, etc.

## 2015-04-26 NOTE — Patient Instructions (Signed)
  I suspect this is a viral syndrome.  No evidence of bacterial infection on exam. Continue to drink plenty of fluids. Continue to use claritin daily, mucinex twice daily. Consider Delsym syrup (vs benzonatate/tessalon) as needed for coughing. Avoid decongestants due to history of hypertension (you can use sparingly, if monitoring your blood pressure) Return if having persistent/worsening symptoms, high fever, etc.

## 2015-05-20 ENCOUNTER — Ambulatory Visit (INDEPENDENT_AMBULATORY_CARE_PROVIDER_SITE_OTHER): Payer: BC Managed Care – PPO | Admitting: Medical

## 2015-05-20 ENCOUNTER — Encounter: Payer: Self-pay | Admitting: Medical

## 2015-05-20 VITALS — BP 110/74 | HR 60 | Wt 156.0 lb

## 2015-05-20 DIAGNOSIS — E785 Hyperlipidemia, unspecified: Secondary | ICD-10-CM | POA: Diagnosis not present

## 2015-05-20 DIAGNOSIS — I10 Essential (primary) hypertension: Secondary | ICD-10-CM | POA: Diagnosis not present

## 2015-05-20 NOTE — Addendum Note (Signed)
Addended by: Carlena Hurl on: 05/20/2015 09:15 AM   Modules accepted: Orders

## 2015-05-20 NOTE — Progress Notes (Signed)
Subjective: Chief Complaint  Patient presents with  . med check    fasting. on cholestrol yet she is not taking medication. no problems or concerns    Here for fasting f/u.  Since last visit she stopped statin per our discussion.  Still eating healthy, exercising regularly.  Here for lab f/u to see if she truly needs to be on statin.  Compliant with Lisinopril '5mg'$  daily.     Since last visit had colonoscopy 09/2014, due to repeat again in 5 years.    Had flu shot Dr. Toney Rakes office this past fall.     Sees dermatology regularly, saw them yesterday.  Sees. Dr. Nevada Crane  Past Medical History  Diagnosis Date  . Hyperlipidemia   . Iron deficiency 06/2009    negative EGD and colonoscopy 08/2009  . BCC (basal cell carcinoma)     right eye, LLE  . Colon polyp 08/2009    WNL, internal hemorrhoid (Dr. Earlean Shawl); also had colonoscopy 2008 with Dr. Carlean Purl  . Hypertension   . Menopausal state   . Vaginal atrophy   . Encounter for routine gynecological examination     sees Dr. Uvaldo Rising  . H/O bone density study 11/2013    normal  . Anemia     normal CBC 08/2014   Family History  Problem Relation Age of Onset  . Cancer Mother 37    ovarian cancer  . Hypertension Mother   . Osteoporosis Mother   . Cancer Father     esophageal  . Alcohol abuse Father   . Hyperlipidemia Father   . Diabetes Maternal Aunt   . Diabetes Cousin   . Diabetes Cousin   . Cancer Brother     MULTIPLE MYELOMA  . Heart disease Paternal Grandfather      ROS as in subjective   Objective: BP 110/74 mmHg  Pulse 60  Wt 156 lb (70.761 kg)  BP Readings from Last 3 Encounters:  05/20/15 110/74  04/26/15 140/80  02/01/15 130/72   General appearance: alert, no distress, WD/WN Oral cavity: MMM, no lesions Neck: supple, no lymphadenopathy, no thyromegaly, no masses, no bruits Heart: RRR, normal S1, S2, no murmurs Lungs: CTA bilaterally, no wheezes, rhonchi, or rales Pulses: 2+ symmetric, upper and lower  extremities, normal cap refill Ext: no edema   Assessment: Encounter Diagnoses  Name Primary?  . Hyperlipidemia Yes  . Essential hypertension, benign   . Essential hypertension     Plan: C/t healthy diet, routine exercise C/t Lisinopril '5mg'$  daily Pending NMR lab, if normal, will plan to recheck lipids in 3 years.  If abnormal, will need to stay on statin indefinitely.  Shon was seen today for med check.  Diagnoses and all orders for this visit:  Hyperlipidemia -     NMR Lipoprofile with Lipids  Essential hypertension, benign  Essential hypertension -     NMR Lipoprofile with Lipids

## 2015-05-25 LAB — CARDIO IQ(R) ADVANCED LIPID PANEL
Apolipoprotein B: 98 mg/dL (ref 49–103)
CHOLESTEROL, TOTAL (CARDIO IQ ADV LIPID PANEL): 217 mg/dL — AB (ref 125–200)
Cholesterol/HDL Ratio: 4 calc (ref <=5.0)
HDL Cholesterol: 54 mg/dL (ref >=46)
LDL LARGE: 5376 nmol/L (ref 5038–17886)
LDL MEDIUM: 259 nmol/L (ref 121–397)
LDL PARTICLE NUMBER: 1202 nmol/L (ref 1016–2185)
LDL PEAK SIZE: 222.3 Angstrom (ref >=218.2)
LDL Small: 130 nmol/L (ref 115–386)
LDL, Calculated: 144 mg/dL — ABNORMAL HIGH
Lipoprotein (a): 36 nmol/L (ref <75)
Non-HDL Cholesterol: 163 mg/dL
Triglycerides: 94 mg/dL

## 2015-06-14 ENCOUNTER — Encounter: Payer: Self-pay | Admitting: Medical

## 2015-10-18 ENCOUNTER — Encounter: Payer: Self-pay | Admitting: Gynecology

## 2015-10-18 ENCOUNTER — Other Ambulatory Visit: Payer: Self-pay | Admitting: *Deleted

## 2015-10-18 ENCOUNTER — Ambulatory Visit (INDEPENDENT_AMBULATORY_CARE_PROVIDER_SITE_OTHER): Payer: BC Managed Care – PPO | Admitting: Gynecology

## 2015-10-18 VITALS — BP 128/74 | Ht 67.5 in | Wt 155.0 lb

## 2015-10-18 DIAGNOSIS — Z8041 Family history of malignant neoplasm of ovary: Secondary | ICD-10-CM | POA: Diagnosis not present

## 2015-10-18 DIAGNOSIS — Z01419 Encounter for gynecological examination (general) (routine) without abnormal findings: Secondary | ICD-10-CM

## 2015-10-18 DIAGNOSIS — Z780 Asymptomatic menopausal state: Secondary | ICD-10-CM | POA: Diagnosis not present

## 2015-10-18 NOTE — Patient Instructions (Signed)

## 2015-10-18 NOTE — Progress Notes (Signed)
Sherri Rangel 12-May-1955 [id number]   History:    60 y.o.  for annual gyn exam who stated that her PCP last year to control for statin and she is not sure she's to continue being off of it. She will come back next week for fasting blood work year. She also has had past history of hypertension. She is menopausal with no vasomotor symptoms and on no hormone replacement therapy. Patient with past history of colon polyps. She had a colonoscopy in 2016 which was normal she is on a 5 year recall.Patient's mother had an aggressive ovarian cancer and for this reason she is having a pelvic ultrasound every year along with a CA 125 although patient is aware of its limitations. Patient with no past history of any abnormal Pap smears. Patient with normal bone density study in 2015.  Patient in September 2016 had a normal CA 125 and her ultrasound demonstrated the following: Uterus measured 6.3 x 5.0 x 2.4 cm with endometrial stripe of 1 mm. A right thick wall collapsed follicle measured 7 x 9 x 5 mm was noted. No fluid in the cul-de-sac. Left ovary normal.     Past medical history,surgical history, family history and social history were all reviewed and documented in the EPIC chart.  Gynecologic History No LMP recorded. Patient is postmenopausal. Contraception: post menopausal status Last Pap: 2016. Results were: normal Last mammogram: 2016. Results were: Normal but dense  Obstetric History OB History  Gravida Para Term Preterm AB Living  3 3       3   SAB TAB Ectopic Multiple Live Births               # Outcome Date GA Lbr Len/2nd Weight Sex Delivery Anes PTL Lv  3 Para           2 Para           1 Para                ROS: A ROS was performed and pertinent positives and negatives are included in the history.  GENERAL: No fevers or chills. HEENT: No change in vision, no earache, sore throat or sinus congestion. NECK: No pain or stiffness. CARDIOVASCULAR: No chest pain or pressure. No  palpitations. PULMONARY: No shortness of breath, cough or wheeze. GASTROINTESTINAL: No abdominal pain, nausea, vomiting or diarrhea, melena or bright red blood per rectum. GENITOURINARY: No urinary frequency, urgency, hesitancy or dysuria. MUSCULOSKELETAL: No joint or muscle pain, no back pain, no recent trauma. DERMATOLOGIC: No rash, no itching, no lesions. ENDOCRINE: No polyuria, polydipsia, no heat or cold intolerance. No recent change in weight. HEMATOLOGICAL: No anemia or easy bruising or bleeding. NEUROLOGIC: No headache, seizures, numbness, tingling or weakness. PSYCHIATRIC: No depression, no loss of interest in normal activity or change in sleep pattern.     Exam: chaperone present  BP 128/74   Ht 5' 7.5" (1.715 m)   Wt 155 lb (70.3 kg)   BMI 23.92 kg/m   Body mass index is 23.92 kg/m.  General appearance : Well developed well nourished female. No acute distress HEENT: Eyes: no retinal hemorrhage or exudates,  Neck supple, trachea midline, no carotid bruits, no thyroidmegaly Lungs: Clear to auscultation, no rhonchi or wheezes, or rib retractions  Heart: Regular rate and rhythm, no murmurs or gallops Breast:Examined in sitting and supine position were symmetrical in appearance, no palpable masses or tenderness,  no skin retraction, no nipple inversion, no nipple discharge, no  skin discoloration, no axillary or supraclavicular lymphadenopathy Abdomen: no palpable masses or tenderness, no rebound or guarding Extremities: no edema or skin discoloration or tenderness  Pelvic:  Bartholin, Urethra, Skene Glands: Within normal limits             Vagina: No gross lesions or discharge  Cervix: No gross lesions or discharge  Uterus  anteverted, normal size, shape and consistency, non-tender and mobile  Adnexa  Without masses or tenderness  Anus and perineum  normal   Rectovaginal  normal sphincter tone without palpated masses or tenderness             Hemoccult colonoscopy less than 12  months ago normal     Assessment/Plan:  60 y.o. female for annual exam menopausal no hormone replacement therapy. Patient asymptomatic otherwise. Patient return to the office in 1-2 weeks for the following screening blood work: Comprehensive metabolic panel, fasting lipid profile, TSH, CBC, and urinalysis. Because of family history of ovarian cancer a CA 125 will be drawn and she will schedule also and ultrasound as well. She is due for bone density study in October. We discussed importance of calcium vitamin D and weightbearing exercises for osteoporosis prevention.   Terrance Mass MD, 9:58 AM 10/18/2015

## 2015-10-20 ENCOUNTER — Other Ambulatory Visit: Payer: BC Managed Care – PPO

## 2015-10-25 ENCOUNTER — Other Ambulatory Visit: Payer: Self-pay | Admitting: Medical

## 2015-10-25 DIAGNOSIS — E785 Hyperlipidemia, unspecified: Secondary | ICD-10-CM

## 2015-10-25 DIAGNOSIS — I10 Essential (primary) hypertension: Secondary | ICD-10-CM

## 2015-10-27 ENCOUNTER — Ambulatory Visit (INDEPENDENT_AMBULATORY_CARE_PROVIDER_SITE_OTHER): Payer: BC Managed Care – PPO

## 2015-10-27 ENCOUNTER — Other Ambulatory Visit: Payer: Self-pay | Admitting: Gynecology

## 2015-10-27 ENCOUNTER — Encounter: Payer: Self-pay | Admitting: Gynecology

## 2015-10-27 ENCOUNTER — Ambulatory Visit (INDEPENDENT_AMBULATORY_CARE_PROVIDER_SITE_OTHER): Payer: BC Managed Care – PPO | Admitting: Gynecology

## 2015-10-27 VITALS — BP 120/78

## 2015-10-27 DIAGNOSIS — N83201 Unspecified ovarian cyst, right side: Secondary | ICD-10-CM | POA: Diagnosis not present

## 2015-10-27 DIAGNOSIS — Z23 Encounter for immunization: Secondary | ICD-10-CM | POA: Diagnosis not present

## 2015-10-27 DIAGNOSIS — Z8041 Family history of malignant neoplasm of ovary: Secondary | ICD-10-CM | POA: Diagnosis not present

## 2015-10-27 LAB — COMPREHENSIVE METABOLIC PANEL
ALBUMIN: 4.5 g/dL (ref 3.6–5.1)
ALK PHOS: 84 U/L (ref 33–130)
ALT: 14 U/L (ref 6–29)
AST: 21 U/L (ref 10–35)
BILIRUBIN TOTAL: 0.6 mg/dL (ref 0.2–1.2)
BUN: 11 mg/dL (ref 7–25)
CHLORIDE: 102 mmol/L (ref 98–110)
CO2: 27 mmol/L (ref 20–31)
CREATININE: 0.81 mg/dL (ref 0.50–0.99)
Calcium: 9.3 mg/dL (ref 8.6–10.4)
Glucose, Bld: 92 mg/dL (ref 65–99)
Potassium: 4.3 mmol/L (ref 3.5–5.3)
SODIUM: 137 mmol/L (ref 135–146)
TOTAL PROTEIN: 7.3 g/dL (ref 6.1–8.1)

## 2015-10-27 LAB — CBC WITH DIFFERENTIAL/PLATELET
BASOS ABS: 0 {cells}/uL (ref 0–200)
BASOS PCT: 0 %
EOS ABS: 51 {cells}/uL (ref 15–500)
EOS PCT: 1 %
HCT: 40.1 % (ref 35.0–45.0)
HEMOGLOBIN: 13.1 g/dL (ref 11.7–15.5)
LYMPHS ABS: 1632 {cells}/uL (ref 850–3900)
Lymphocytes Relative: 32 %
MCH: 28.7 pg (ref 27.0–33.0)
MCHC: 32.7 g/dL (ref 32.0–36.0)
MCV: 87.9 fL (ref 80.0–100.0)
MONOS PCT: 6 %
MPV: 9.5 fL (ref 7.5–12.5)
Monocytes Absolute: 306 cells/uL (ref 200–950)
NEUTROS ABS: 3111 {cells}/uL (ref 1500–7800)
Neutrophils Relative %: 61 %
PLATELETS: 267 10*3/uL (ref 140–400)
RBC: 4.56 MIL/uL (ref 3.80–5.10)
RDW: 14.5 % (ref 11.0–15.0)
WBC: 5.1 10*3/uL (ref 3.8–10.8)

## 2015-10-27 LAB — LIPID PANEL
CHOLESTEROL: 221 mg/dL — AB (ref 125–200)
HDL: 61 mg/dL (ref >=46)
LDL Cholesterol: 141 mg/dL — ABNORMAL HIGH (ref <130)
TRIGLYCERIDES: 94 mg/dL (ref <150)
Total CHOL/HDL Ratio: 3.6 Ratio (ref <=5.0)
VLDL: 19 mg/dL (ref <30)

## 2015-10-27 NOTE — Patient Instructions (Addendum)
Diagnostic Laparoscopy A diagnostic laparoscopy is a procedure to diagnose diseases in the abdomen. During the procedure, a thin, lighted, pencil-sized instrument called a laparoscope is inserted into the abdomen through an incision. The laparoscope allows your health care provider to look at the organs inside your body. LET [facility] CARE PROVIDER KNOW ABOUT:  Any allergies you have.  All medicines you are taking, including vitamins, herbs, eye drops, creams, and over-the-counter medicines.  Previous problems you or members of your family have had with the use of anesthetics. Any blood disorders you have.Influenza Virus Vaccine (Flucelvax) What is this medicine? INFLUENZA VIRUS VACCINE (in floo EN zuh VAHY ruhs vak SEEN) helps to reduce the risk of getting influenza also known as the flu. The vaccine only helps protect you against some strains of the flu. This medicine may be used for other purposes; ask your health care provider or pharmacist if you have questions. What should I tell my health care provider before I take this medicine? They need to know if you have any of these conditions: -bleeding disorder like hemophilia -fever or infection -Guillain-Barre syndrome or other neurological problems -immune system problems -infection with the human immunodeficiency virus (HIV) or AIDS -low blood platelet counts -multiple sclerosis -an unusual or allergic reaction to influenza virus vaccine, other medicines, foods, dyes or preservatives -pregnant or trying to get pregnant -breast-feeding How should I use this medicine? This vaccine is for injection into a muscle. It is given by a health care professional. A copy of Vaccine Information Statements will be given before each vaccination. Read this sheet carefully each time. The sheet may change frequently. Talk to your pediatrician regarding the use of this medicine in children. Special care may be needed. Overdosage: If you think you've  taken too much of this medicine contact a poison control center or emergency room at once. Overdosage: If you think you have taken too much of this medicine contact a poison control center or emergency room at once. NOTE: This medicine is only for you. Do not share this medicine with others. What if I miss a dose? This does not apply. What may interact with this medicine? -chemotherapy or radiation therapy -medicines that lower your immune system like etanercept, anakinra, infliximab, and adalimumab -medicines that treat or prevent blood clots like warfarin -phenytoin -steroid medicines like prednisone or cortisone -theophylline -vaccines This list may not describe all possible interactions. Give your health care provider a list of all the medicines, herbs, non-prescription drugs, or dietary supplements you use. Also tell them if you smoke, drink alcohol, or use illegal drugs. Some items may interact with your medicine. What should I watch for while using this medicine? Report any side effects that do not go away within 3 days to your doctor or health care professional. Call your health care provider if any unusual symptoms occur within 6 weeks of receiving this vaccine. You may still catch the flu, but the illness is not usually as bad. You cannot get the flu from the vaccine. The vaccine will not protect against colds or other illnesses that may cause fever. The vaccine is needed every year. What side effects may I notice from receiving this medicine? Side effects that you should report to your doctor or health care professional as soon as possible: -allergic reactions like skin rash, itching or hives, swelling of the face, lips, or tongue Side effects that usually do not require medical attention (Report these to your doctor or health care professional if they  continue or are bothersome.): -fever -headache -muscle aches and pains -pain, tenderness, redness, or swelling at the injection  site -tiredness This list may not describe all possible side effects. Call your doctor for medical advice about side effects. You may report side effects to FDA at 1-800-FDA-1088. Where should I keep my medicine? The vaccine will be given by a health care professional in a clinic, pharmacy, doctor's office, or other health care setting. You will not be given vaccine doses to store at home. NOTE: This sheet is a summary. It may not cover all possible information. If you have questions about this medicine, talk to your doctor, pharmacist, or health care provider.    2016, Elsevier/Gold Standard. (2011-01-18 14:06:47)    Previous surgeries you have had.  Medical conditions you have. RISKS AND COMPLICATIONS  Generally, this is a safe procedure. However, problems can occur, which may include:  Infection.  Bleeding.  Damage to other organs.  Allergic reaction to the anesthetics used during the procedure. BEFORE THE PROCEDURE  Do not eat or drink anything after midnight on the night before the procedure or as directed by your health care provider.  Ask your health care provider about:  Changing or stopping your regular medicines.  Taking medicines such as aspirin and ibuprofen. These medicines can thin your blood. Do not take these medicines before your procedure if your health care provider instructs you not to.  Plan to have someone take you home after the procedure. PROCEDURE  You may be given a medicine to help you relax (sedative).  You will be given a medicine to make you sleep (general anesthetic).  Your abdomen will be inflated with a gas. This will make your organs easier to see.  Small incisions will be made in your abdomen.  A laparoscope and other small instruments will be inserted into the abdomen through the incisions.  A tissue sample may be removed from an organ in the abdomen for examination.  The instruments will be removed from the abdomen.  The gas  will be released.  The incisions will be closed with stitches (sutures). AFTER THE PROCEDURE  Your blood pressure, heart rate, breathing rate, and blood oxygen level will be monitored often until the medicines you were given have worn off.   This information is not intended to replace advice given to you by your health care provider. Make sure you discuss any questions you have with your health care provider.   Document Released: 05/15/2000 Document Revised: 10/28/2014 Document Reviewed: 09/19/2013 Elsevier Interactive Patient Education 2016 Elsevier Inc. CA-125 Tumor Marker Test WHY AM I HAVING THIS TEST? This test is used to check the level of cancer antigen 125 (CA-125) in your blood. The CA-125 tumor marker test can be helpful in detecting ovarian cancer. The test is only performed if you are considered at high risk for ovarian cancer. Your health care provider may recommend this test if:  You have a strong family history of ovarian cancer.  You have a breast cancer antigen (BRCA) genetic defect. If you have already been diagnosed with ovarian cancer, your health care provider may use this test to help identify the extent of the disease and to monitor your response to treatment. WHAT KIND OF SAMPLE IS TAKEN? A blood sample is required for this test. It is usually collected by inserting a needle into a vein. HOW DO I PREPARE FOR THE TEST? There is no preparation required for this test. WHAT ARE THE REFERENCE RANGES? Reference ranges  are considered healthy ranges established after testing a large group of healthy people. Reference ranges may vary among different people, labs, and hospitals. It is your responsibility to obtain your test results. Ask the lab or department performing the test when and how you will get your results. The reference range for this test is 0-35 units/mL or less than 35 kunits/L (SI units). WHAT DO THE RESULTS MEAN? Increased levels of CA-125 may  indicate:  Certain types of cancer, including:  Ovarian cancer.  Pancreatic cancer.  Colon cancer.  Lung cancer.  Breast cancer.  Lymphoma.  Noncancerous (benign) disorders, including:  Cirrhosis.  Pregnancy.  Endometriosis.  Pancreatitis.  Pelvic inflammatory disease (PID). Talk with your health care provider to discuss your results, treatment options, and if necessary, the need for more tests. Talk with your health care provider if you have any questions about your results.   This information is not intended to replace advice given to you by your health care provider. Make sure you discuss any questions you have with your health care provider.   Document Released: 02/29/2004 Document Revised: 02/27/2014 Document Reviewed: 06/26/2013 Elsevier Interactive Patient Education 2016 Elsevier Inc. Ovarian Cyst An ovarian cyst is a fluid-filled sac that forms on an ovary. The ovaries are small organs that produce eggs in women. Various types of cysts can form on the ovaries. Most are not cancerous. Many do not cause problems, and they often go away on their own. Some may cause symptoms and require treatment. Common types of ovarian cysts include:  Functional cysts--These cysts may occur every month during the menstrual cycle. This is normal. The cysts usually go away with the next menstrual cycle if the woman does not get pregnant. Usually, there are no symptoms with a functional cyst.  Endometrioma cysts--These cysts form from the tissue that lines the uterus. They are also called "chocolate cysts" because they become filled with blood that turns brown. This type of cyst can cause pain in the lower abdomen during intercourse and with your menstrual period.  Cystadenoma cysts--This type develops from the cells on the outside of the ovary. These cysts can get very big and cause lower abdomen pain and pain with intercourse. This type of cyst can twist on itself, cut off its blood  supply, and cause severe pain. It can also easily rupture and cause a lot of pain.  Dermoid cysts--This type of cyst is sometimes found in both ovaries. These cysts may contain different kinds of body tissue, such as skin, teeth, hair, or cartilage. They usually do not cause symptoms unless they get very big.  Theca lutein cysts--These cysts occur when too much of a certain hormone (human chorionic gonadotropin) is produced and overstimulates the ovaries to produce an egg. This is most common after procedures used to assist with the conception of a baby (in vitro fertilization). CAUSES   Fertility drugs can cause a condition in which multiple large cysts are formed on the ovaries. This is called ovarian hyperstimulation syndrome.  A condition called polycystic ovary syndrome can cause hormonal imbalances that can lead to nonfunctional ovarian cysts. SIGNS AND SYMPTOMS  Many ovarian cysts do not cause symptoms. If symptoms are present, they may include:  Pelvic pain or pressure.  Pain in the lower abdomen.  Pain during sexual intercourse.  Increasing girth (swelling) of the abdomen.  Abnormal menstrual periods.  Increasing pain with menstrual periods.  Stopping having menstrual periods without being pregnant. DIAGNOSIS  These cysts are commonly found during  a routine or annual pelvic exam. Tests may be ordered to find out more about the cyst. These tests may include:  Ultrasound.  X-ray of the pelvis.  CT scan.  MRI.  Blood tests. TREATMENT  Many ovarian cysts go away on their own without treatment. Your health care provider may want to check your cyst regularly for 2-3 months to see if it changes. For women in menopause, it is particularly important to monitor a cyst closely because of the higher rate of ovarian cancer in menopausal women. When treatment is needed, it may include any of the following:  A procedure to drain the cyst (aspiration). This may be done using a long  needle and ultrasound. It can also be done through a laparoscopic procedure. This involves using a thin, lighted tube with a tiny camera on the end (laparoscope) inserted through a small incision.  Surgery to remove the whole cyst. This may be done using laparoscopic surgery or an open surgery involving a larger incision in the lower abdomen.  Hormone treatment or birth control pills. These methods are sometimes used to help dissolve a cyst. HOME CARE INSTRUCTIONS   Only take over-the-counter or prescription medicines as directed by your health care provider.  Follow up with your health care provider as directed.  Get regular pelvic exams and Pap tests. SEEK MEDICAL CARE IF:   Your periods are late, irregular, or painful, or they stop.  Your pelvic pain or abdominal pain does not go away.  Your abdomen becomes larger or swollen.  You have pressure on your bladder or trouble emptying your bladder completely.  You have pain during sexual intercourse.  You have feelings of fullness, pressure, or discomfort in your stomach.  You lose weight for no apparent reason.  You feel generally ill.  You become constipated.  You lose your appetite.  You develop acne.  You have an increase in body and facial hair.  You are gaining weight, without changing your exercise and eating habits.  You think you are pregnant. SEEK IMMEDIATE MEDICAL CARE IF:   You have increasing abdominal pain.  You feel sick to your stomach (nauseous), and you throw up (vomit).  You develop a fever that comes on suddenly.  You have abdominal pain during a bowel movement.  Your menstrual periods become heavier than usual. MAKE SURE YOU:  Understand these instructions.  Will watch your condition.  Will get help right away if you are not doing well or get worse.   This information is not intended to replace advice given to you by your health care provider. Make sure you discuss any questions you have  with your health care provider.   Document Released: 02/06/2005 Document Revised: 02/11/2013 Document Reviewed: 10/14/2012 Elsevier Interactive Patient Education [employer].

## 2015-10-27 NOTE — Progress Notes (Signed)
   Patient is a 60 year old that presented to the office today to discuss her ultrasound. She was seen the office for her annual exam on 09/28/2015. Patient is here also for fasting blood work since she was not fasting at time of her last visit. Patient is having an annual ultrasound screen and CA 125 because her mother in her 108s had an aggressive type of ovarian cancer in the [place] sure how long she may have had it. Review of her record indicated the following:  September 2016 had a normal CA 125 and her ultrasound demonstrated the following: Uterus measured 6.3 x 5.0 x 2.4 cm with endometrial stripe of 1 mm. A right thick wall collapsed follicle measured 7 x 9 x 5 mm was noted. No fluid in the cul-de-sac. Left ovary normal.   Ultrasound today: Uterus measures 6.8 x 5.0 x 2.6 cm with endometrial stripe of 1 mm. Right ovary continued presence of thick wall cyst measuring 9 x 9 mm. No significant change in size or echo pattern with internal low level echoes negative color flow Doppler. Left ovary was normal. No fluid in the cul-de-sac.  Assessment/plan: Persistent thick wall cyst on low small has been present since 2016. I have reviewed all her ultrasounds dating back to 2011 and has not been present. She has had normal CA 125. I have recommended the patient consider laparoscopic bilateral salpingo-oophorectomy because of her mother's history of aggressive ovarian cancer and not clear as to the history as to how long she may have had it. We will check a CA 125 today although patient is aware of its limitations. The risks benefits and pros and cons of laparoscopic bilateral salpingo-oophorectomy were outlined and discuss today as well as the procedure. Literature information was provided all the above subject. Patient also received the flu vaccine.  Greater than 50% of the time was spent in counseling and coordinating patient's surgery for laparoscopic bilateral salpingo-oophorectomy as a result of  persistent ovarian cyst and family history of ovarian cancer. Time of consultation 15 minutes

## 2015-10-28 ENCOUNTER — Telehealth: Payer: Self-pay

## 2015-10-28 LAB — URINALYSIS W MICROSCOPIC + REFLEX CULTURE
BACTERIA UA: NONE SEEN [HPF]
Bilirubin Urine: NEGATIVE
CASTS: NONE SEEN [LPF]
CRYSTALS: NONE SEEN [HPF]
Glucose, UA: NEGATIVE
HGB URINE DIPSTICK: NEGATIVE
Ketones, ur: NEGATIVE
Leukocytes, UA: NEGATIVE
Nitrite: NEGATIVE
PROTEIN: NEGATIVE
RBC / HPF: NONE SEEN RBC/HPF (ref <=2)
SQUAMOUS EPITHELIAL / LPF: NONE SEEN [HPF] (ref <=5)
Specific Gravity, Urine: 1.004 (ref 1.001–1.035)
WBC, UA: NONE SEEN WBC/HPF (ref <=5)
YEAST: NONE SEEN [HPF]
pH: 5.5 (ref 5.0–8.0)

## 2015-10-28 LAB — TSH: TSH: 1.37 mIU/L

## 2015-10-28 LAB — CA 125: CA 125: 27 U/mL (ref <35)

## 2015-10-28 NOTE — Telephone Encounter (Signed)
Okay thank you

## 2015-10-28 NOTE — Telephone Encounter (Signed)
I contacted patient about scheduling lap bso. We discussed her ins benefits and her estimated financial responsibility.  Patient has not met any of her deductible for this year and has a calendar year ins plan. She has decided for financial reasons and with her job schedule that she would like to wait until January to schedule surgery.  I will call her in Nov when I get that schedule to arrange a date with her.

## 2015-11-22 ENCOUNTER — Other Ambulatory Visit: Payer: Self-pay | Admitting: Medical

## 2015-11-22 DIAGNOSIS — I10 Essential (primary) hypertension: Secondary | ICD-10-CM

## 2015-11-22 DIAGNOSIS — E785 Hyperlipidemia, unspecified: Secondary | ICD-10-CM

## 2015-11-24 ENCOUNTER — Ambulatory Visit (INDEPENDENT_AMBULATORY_CARE_PROVIDER_SITE_OTHER): Payer: BC Managed Care – PPO | Admitting: Medical

## 2015-11-24 ENCOUNTER — Telehealth: Payer: Self-pay | Admitting: Medical

## 2015-11-24 VITALS — BP 100/72 | HR 84 | Ht 67.5 in | Wt 155.4 lb

## 2015-11-24 DIAGNOSIS — N83209 Unspecified ovarian cyst, unspecified side: Secondary | ICD-10-CM | POA: Diagnosis not present

## 2015-11-24 DIAGNOSIS — E785 Hyperlipidemia, unspecified: Secondary | ICD-10-CM

## 2015-11-24 DIAGNOSIS — Z8041 Family history of malignant neoplasm of ovary: Secondary | ICD-10-CM

## 2015-11-24 DIAGNOSIS — I10 Essential (primary) hypertension: Secondary | ICD-10-CM

## 2015-11-24 NOTE — Telephone Encounter (Signed)
Please call patient.  Regarding cholesterol, I spoke to Dr. Redmond School and he agrees that it would be reasonable to hold off on taking statin per new criteria.   We will have to agree to disagree with Dr. Toney Rakes recommendation on this one.   There is no right or wrong answer, but given her lower risk, I would not take a statin at this time.  regarding ovarian cyst, I think getting another opinion from a different gynecologist is reasonable before proceeding to surgery to remove the cyst.  Sometimes we just monitor cysts year to year with ultrasound which would also be a reasonable approach.

## 2015-11-24 NOTE — Progress Notes (Signed)
Subjective: Chief Complaint  Patient presents with  . Follow-up    medication refills    Here for refills.  I saw her back in 04/2015 for recheck on lipids.  At that time we performed a NMR lipoprofile lab to further eval her risk of heart disease.  At the time she was still taking a statin, and had been taking statins for 10 or so years at least.   We made a decision to stop the statin then.  In the meantime, she had her gynecologic yearly visit recently and she was advised to restart statin. She is here to discus or get refills.  She also wants another opinion about her ovarian cyst.  She has family hx/o of ovarian cyst in her mother, but mother was diagnosed late in age with this.  Her brother who is a gynecologist recommends she c/t watch and wait approach vs having ovaries removed which was recommend by gyn.  Past Medical History:  Diagnosis Date  . Anemia    normal CBC 08/2014  . BCC (basal cell carcinoma)    right eye, LLE  . Colon polyp 08/2009   WNL, internal hemorrhoid (Dr. Earlean Shawl); also had colonoscopy 2008 with Dr. Carlean Purl  . Encounter for routine gynecological examination    sees Dr. Uvaldo Rising  . H/O bone density study 11/2013   normal  . Hyperlipidemia   . Hypertension   . Iron deficiency 06/2009   negative EGD and colonoscopy 08/2009  . Menopausal state   . Vaginal atrophy     Family History  Problem Relation Age of Onset  . Cancer Mother 25    ovarian cancer  . Hypertension Mother   . Osteoporosis Mother   . Cancer Father     esophageal  . Alcohol abuse Father   . Hyperlipidemia Father   . Cancer Brother     MULTIPLE MYELOMA  . Diabetes Maternal Aunt   . Diabetes Cousin   . Diabetes Cousin   . Heart disease Paternal Grandfather     ROS as in subjective   Objective: BP 100/72   Pulse 84   Ht 5' 7.5" (1.715 m)   Wt 155 lb 6 oz (70.5 kg)   SpO2 98%   BMI 23.98 kg/m   General appearance: alert, no distress, WD/WN,  Neck: supple, no  lymphadenopathy, no thyromegaly, no masses Heart: RRR, normal S1, S2, no murmurs Lungs: CTA bilaterally, no wheezes, rhonchi, or rales Pulses: 2+ symmetric, upper and lower extremities, normal cap refill Ext: no edema   Adult ECG Report  Indication: hypertension  Rate: 60 bpm  Rhythm: normal sinus rhythm  QRS Axis: -13 degrees  PR Interval: 158m  QRS Duration: 967m QTc: 4289mConduction Disturbances: none  Other Abnormalities: nonspecific T wave inversion III  Patient's cardiac risk factors are: hypertension.  EKG comparison: none  Narrative Interpretation: normal EKG   Assessment: Encounter Diagnoses  Name Primary?  . Essential hypertension, benign Yes  . Hyperlipidemia, unspecified hyperlipidemia type   . Family history of ovarian cancer   . Cyst of ovary, unspecified laterality     Plan: C/t Lisinopril '5mg'$  daily.     After disussing her risk factors for heart disease, hypertension and almost 60yo, I gave her the option of using statin.  Discussed benefits /risks of statins.   We could make a case for or against the statin, but its up to her.   Given the Lipoprofile lab in 04/2015, I advised that she could  reasonably abstain from statin for now.  She will let me know  Ovarian cyst - I will defer this to gynecology.  She may end up getting another opinion from a different gynecologies.    F/u yearly for physical

## 2015-11-25 NOTE — Telephone Encounter (Signed)
Pt informed word for word and verbalized understanding 

## 2015-12-02 ENCOUNTER — Other Ambulatory Visit: Payer: Self-pay | Admitting: Gynecology

## 2015-12-02 ENCOUNTER — Ambulatory Visit (INDEPENDENT_AMBULATORY_CARE_PROVIDER_SITE_OTHER): Payer: BC Managed Care – PPO

## 2015-12-02 DIAGNOSIS — Z780 Asymptomatic menopausal state: Secondary | ICD-10-CM | POA: Diagnosis not present

## 2015-12-02 DIAGNOSIS — Z13820 Encounter for screening for osteoporosis: Secondary | ICD-10-CM | POA: Diagnosis not present

## 2015-12-03 ENCOUNTER — Other Ambulatory Visit: Payer: Self-pay | Admitting: *Deleted

## 2015-12-03 DIAGNOSIS — E559 Vitamin D deficiency, unspecified: Secondary | ICD-10-CM

## 2016-01-21 ENCOUNTER — Telehealth: Payer: Self-pay

## 2016-01-21 NOTE — Telephone Encounter (Signed)
I called patient to let her know I now have January schedule. I explained block time on Tuesdays at [facility] and I have 2nd, 9th and 16th available. Patient said she is not able to do any of these dates and prefers to wait on the Feb schedule. I will call her first as soon as I get the Feb schedule.

## 2016-01-24 ENCOUNTER — Other Ambulatory Visit: Payer: Self-pay | Admitting: Medical

## 2016-01-24 DIAGNOSIS — I10 Essential (primary) hypertension: Secondary | ICD-10-CM

## 2016-01-24 DIAGNOSIS — E785 Hyperlipidemia, unspecified: Secondary | ICD-10-CM

## 2016-02-23 ENCOUNTER — Telehealth: Payer: Self-pay

## 2016-02-23 NOTE — Telephone Encounter (Signed)
I called patient to let her know that I now have Feb schedule available.  She said that she is going to need to wait until March to schedule. I will call her with March schedule.

## 2016-03-01 ENCOUNTER — Other Ambulatory Visit: Payer: BC Managed Care – PPO

## 2016-03-01 DIAGNOSIS — E559 Vitamin D deficiency, unspecified: Secondary | ICD-10-CM

## 2016-03-02 LAB — VITAMIN D 25 HYDROXY (VIT D DEFICIENCY, FRACTURES): Vit D, 25-Hydroxy: 47 ng/mL (ref 30–100)

## 2016-03-16 ENCOUNTER — Telehealth: Payer: Self-pay

## 2016-03-16 NOTE — Telephone Encounter (Signed)
At a previous request from patient I called her to let her know I now have the schedule for March and can schedule her surgery.  This is not a good time for her and she said it would be June before she could do it. She asked me to call her with June schedule if I dont hear from her before that time. I will do that.

## 2016-03-20 ENCOUNTER — Other Ambulatory Visit: Payer: Self-pay | Admitting: Medical

## 2016-03-20 DIAGNOSIS — E785 Hyperlipidemia, unspecified: Secondary | ICD-10-CM

## 2016-03-20 DIAGNOSIS — I10 Essential (primary) hypertension: Secondary | ICD-10-CM

## 2016-05-31 ENCOUNTER — Telehealth: Payer: Self-pay

## 2016-05-31 NOTE — Telephone Encounter (Signed)
I called patient today at her request to let her know I now have June schedule available for surgery scheduling.  I made her aware of Dr. Durenda Guthrie upcoming retirement and that ideally we would like to try to schedule by end of June.  She said she cannot schedule now as school calendar running later. She was just not prepared mentally to schedule as she has not given it any thought. I assured her this was fine and I will just hold on to her surgery order and when she is ready to schedule she can give me a call.

## 2016-06-19 ENCOUNTER — Encounter: Payer: Self-pay | Admitting: Medical

## 2016-06-19 ENCOUNTER — Ambulatory Visit (INDEPENDENT_AMBULATORY_CARE_PROVIDER_SITE_OTHER): Payer: BC Managed Care – PPO | Admitting: Medical

## 2016-06-19 VITALS — BP 112/68 | HR 59 | Wt 155.4 lb

## 2016-06-19 DIAGNOSIS — E785 Hyperlipidemia, unspecified: Secondary | ICD-10-CM | POA: Diagnosis not present

## 2016-06-19 DIAGNOSIS — L299 Pruritus, unspecified: Secondary | ICD-10-CM | POA: Diagnosis not present

## 2016-06-19 DIAGNOSIS — N83209 Unspecified ovarian cyst, unspecified side: Secondary | ICD-10-CM

## 2016-06-19 DIAGNOSIS — I10 Essential (primary) hypertension: Secondary | ICD-10-CM

## 2016-06-19 MED ORDER — LISINOPRIL 5 MG PO TABS: ORAL_TABLET | ORAL | 3 refills | Status: DC

## 2016-06-19 NOTE — Progress Notes (Signed)
Subjective: Chief Complaint  Patient presents with  . med check    med check, left ear icthy on the outside    Here for med check.    Medical team: Gynecology Dr. [place] Crane dermatology Dr. Earlean Shawl, GI Glade Lloyd, Tayveon Lombardo [facility], PA-C here for primary care  Having some itching of left ear for weeks.   Uses neosporin and this helps, similarly coconut helps, but then the itching starts when she stops the cream.  HTN - compliant with Lisinopril 5mg  daily, no side effects.   Taking Aspirin nightly.   Still pending second opinion about ovarian cyst. Brother is gynecologist in [place].    Past Medical History:  Diagnosis Date  . Anemia    normal CBC 08/2014  . BCC (basal cell carcinoma)    right eye, LLE  . Colon polyp 08/2009   WNL, internal hemorrhoid (Dr. Earlean Shawl); also had colonoscopy 2008 with Dr. Carlean Purl  . Encounter for routine gynecological examination    sees Dr. Uvaldo Rising  . H/O bone density study 11/2013   normal  . Hyperlipidemia   . Hypertension   . Iron deficiency 06/2009   negative EGD and colonoscopy 08/2009  . Menopausal state   . Vaginal atrophy    Current Outpatient Prescriptions on File Prior to Visit  Medication Sig Dispense Refill  . aspirin 81 MG tablet Take 1 tablet (81 mg total) by mouth daily. 90 tablet 3  . Calcium Carbonate-Vitamin D (CALCIUM 600+D) 600-400 MG-UNIT per tablet Take 1 tablet by mouth daily.      Marland Kitchen co-enzyme Q-10 30 MG capsule Take 30 mg by mouth 3 (three) times daily.    . cycloSPORINE (RESTASIS) 0.05 % ophthalmic emulsion 1 drop 2 (two) times daily.    . Multiple Vitamin (MULTIVITAMIN) tablet Take 1 tablet by mouth daily.      Marland Kitchen RA KRILL OIL PO Take 750 mg by mouth 2 (two) times daily.     No current facility-administered medications on file prior to visit.    Past Surgical History:  Procedure Laterality Date  . BREAST SURGERY     RIGHT BREAST BIOPSY/FCD  . COLONOSCOPY  09/2014   Dr. Earlean Shawl, prior 2011.   due back 2021, hx/o  polyps  . skin tags     ROS as in subjective   Objective: BP 112/68   Pulse (!) 59   Wt 155 lb 6.4 oz (70.5 kg)   SpO2 99%   BMI 23.98 kg/m   BP Readings from Last 3 Encounters:  06/19/16 112/68  11/24/15 100/72  10/27/15 120/78   Wt Readings from Last 3 Encounters:  06/19/16 155 lb 6.4 oz (70.5 kg)  11/24/15 155 lb 6 oz (70.5 kg)  10/18/15 155 lb (70.3 kg)   General appearance: alert, no distress, WD/WN  Oral cavity: MMM, no lesions No obvious ear abnormality on the left, right inner ear posterior wall with 45mm raised flesh colored lesion, otherwise ENT unremarkable Neck: supple, no lymphadenopathy, no thyromegaly, no masses, no bruits Heart: RRR, normal S1, S2, no murmurs Lungs: CTA bilaterally, no wheezes, rhonchi, or rales Pulses: 2+ symmetric, upper and lower extremities, normal cap refill Ext: no edema   Assessment: Encounter Diagnoses  Name Primary?  . Essential hypertension Yes  . Hyperlipidemia, unspecified hyperlipidemia type   . Essential hypertension, benign   . Cyst of ovary, unspecified laterality   . Ear itch      Plan: HTN - c/t Lisinopril 5mg  daily, healthy lifestyle  hyperlipidemia- reviewed labs  from fall through gyn.  She will use diet modification to help improve lipids.  Advised she not eat meat every meal or every day, and cut back on some foods.  She does endorse eating cookies and ice cream a little more regularly than she should.  She is exercising regularly  Cyst of ovary - c/t regular f/u with gyn, ultrasound, CA125  Ear itch - no obvious left ear abnormality.  Can use vaso line topically.  Ear lesion on right - she will discusses with dermatology when she sees them this week.   f/u yearly for physical

## 2016-07-05 ENCOUNTER — Encounter: Payer: Self-pay | Admitting: Gynecology

## 2016-10-18 ENCOUNTER — Ambulatory Visit: Payer: BC Managed Care – PPO | Admitting: Gynecology

## 2016-10-18 LAB — HM MAMMOGRAPHY: HM Mammogram: NORMAL (ref 0–4)

## 2016-10-31 ENCOUNTER — Ambulatory Visit (INDEPENDENT_AMBULATORY_CARE_PROVIDER_SITE_OTHER): Payer: BC Managed Care – PPO | Admitting: Obstetrics & Gynecology

## 2016-10-31 ENCOUNTER — Encounter: Payer: Self-pay | Admitting: Obstetrics & Gynecology

## 2016-10-31 VITALS — BP 122/78 | Ht 67.75 in | Wt 151.0 lb

## 2016-10-31 DIAGNOSIS — N83201 Unspecified ovarian cyst, right side: Secondary | ICD-10-CM

## 2016-10-31 DIAGNOSIS — Z01419 Encounter for gynecological examination (general) (routine) without abnormal findings: Secondary | ICD-10-CM | POA: Diagnosis not present

## 2016-10-31 DIAGNOSIS — Z780 Asymptomatic menopausal state: Secondary | ICD-10-CM

## 2016-10-31 NOTE — Progress Notes (Signed)
TANYAH DEBRUYNE Oct 15, 1955 [id number]   History:    61 y.o. G3P3 Married  RP:  Established patient presenting for annual gyn exam   HPI:  Menopause.  No HRT.  No PMB.  No Pelvic pain.  Last year Pelvic US stable small benign appearing Rt ovarian cyst with Ca125 normal at 25.  Breasts wnl.  Mictions/BMs wnl.  Fit/eating well.  BMI 23.13.  Past medical history,surgical history, family history and social history were all reviewed and documented in the EPIC chart.  Gynecologic History No LMP recorded. Patient is postmenopausal. Contraception: post menopausal status Last Pap: 2016. Results were: normal Last mammogram: 10/18/2016. Results were: Negative Bone density 2017 [place] 2016  Obstetric History OB History  Gravida Para Term Preterm AB Living  3 3       3   SAB TAB Ectopic Multiple Live Births               # Outcome Date GA Lbr Len/2nd Weight Sex Delivery Anes PTL Lv  3 Para           2 Para           1 Para                ROS: A ROS was performed and pertinent positives and negatives are included in the history.  GENERAL: No fevers or chills. HEENT: No change in vision, no earache, sore throat or sinus congestion. NECK: No pain or stiffness. CARDIOVASCULAR: No chest pain or pressure. No palpitations. PULMONARY: No shortness of breath, cough or wheeze. GASTROINTESTINAL: No abdominal pain, nausea, vomiting or diarrhea, melena or bright red blood per rectum. GENITOURINARY: No urinary frequency, urgency, hesitancy or dysuria. MUSCULOSKELETAL: No joint or muscle pain, no back pain, no recent trauma. DERMATOLOGIC: No rash, no itching, no lesions. ENDOCRINE: No polyuria, polydipsia, no heat or cold intolerance. No recent change in weight. HEMATOLOGICAL: No anemia or easy bruising or bleeding. NEUROLOGIC: No headache, seizures, numbness, tingling or weakness. PSYCHIATRIC: No depression, no loss of interest in normal activity or change in sleep pattern.     Exam:   BP 122/78    Ht 5' 7.75" (1.721 m)   Wt 151 lb (68.5 kg)   BMI 23.13 kg/m   Body mass index is 23.13 kg/m.  General appearance : Well developed well nourished female. No acute distress HEENT: Eyes: no retinal hemorrhage or exudates,  Neck supple, trachea midline, no carotid bruits, no thyroidmegaly Lungs: Clear to auscultation, no rhonchi or wheezes, or rib retractions  Heart: Regular rate and rhythm, no murmurs or gallops Breast:Examined in sitting and supine position were symmetrical in appearance, no palpable masses or tenderness,  no skin retraction, no nipple inversion, no nipple discharge, no skin discoloration, no axillary or supraclavicular lymphadenopathy Abdomen: no palpable masses or tenderness, no rebound or guarding Extremities: no edema or skin discoloration or tenderness  Pelvic: Vulva normal  Bartholin, Urethra, Skene Glands: Within normal limits             Vagina: No gross lesions or discharge  Cervix: No gross lesions or discharge.  Pap reflex done.  Uterus  AV, normal size, shape and consistency, non-tender and mobile  Adnexa  Without masses or tenderness  Anus and perineum  normal   Pelvic US 10/27/2015:  Right ovary continued presence of thick wall cyst measuring 9 x 9 mm. No significant change in size or echo pattern with internal low level echoes negative color flow Doppler. Left ovary  was normal. No fluid in the cul-de-sac.  Ca125 wnl at 73   Assessment/Plan:  61 y.o. female for annual exam   1. Encounter for routine gynecological examination with Papanicolaou smear of cervix Normal gyn exam.  Pap reflex done.  Breasts wnl.  Recent Mammo neg.  2. Menopause present No HRT.  No PMB.  Asymptomatic.  Vit D supplement/Ca++.  Weight bearing physical activity.  Repeat BD 2019.  3. Right ovarian cyst Per last Pelvic US and Ca125 10/2015, probably benign.  Normal gyn exam today/No pelvic pain.  Will repeat Pelvic US and Ca125 to confirm stability. - US Transvaginal Non-OB;  Future - CA 125  Counseling on above issues >50% x 10 minutes  Princess Bruins MD, 12:02 PM 10/31/2016

## 2016-11-01 LAB — PAP IG W/ RFLX HPV ASCU

## 2016-11-01 LAB — CA 125: CA 125: 25 U/mL (ref <35)

## 2016-11-02 NOTE — Patient Instructions (Signed)
1. Encounter for routine gynecological examination with Papanicolaou smear of cervix Normal gyn exam.  Pap reflex done.  Breasts wnl.  Recent Mammo neg.  2. Menopause present No HRT.  No PMB.  Asymptomatic.  Vit D supplement/Ca++.  Weight bearing physical activity.  Repeat BD 2019.  3. Right ovarian cyst Per last Pelvic US and Ca125 10/2015, probably benign.  Normal gyn exam today/No pelvic pain.  Will repeat Pelvic US and Ca125 to confirm stability. - US Transvaginal Non-OB; Future - CA 125  Sherri Rangel, it was a pleasure to meet you today!  I will inform you of your results as soon as available.   Health Maintenance for Postmenopausal Women Menopause is a normal process in which your reproductive ability comes to an end. This process happens gradually over a span of months to years, usually between the ages of 39 and 48. Menopause is complete when you have missed 12 consecutive menstrual periods. It is important to talk with your health care provider about some of the most common conditions that affect postmenopausal women, such as heart disease, cancer, and bone loss (osteoporosis). Adopting a healthy lifestyle and getting preventive care can help to promote your health and wellness. Those actions can also lower your chances of developing some of these common conditions. What should I know about menopause? During menopause, you may experience a number of symptoms, such as:  Moderate-to-severe hot flashes.  Night sweats.  Decrease in sex drive.  Mood swings.  Headaches.  Tiredness.  Irritability.  Memory problems.  Insomnia.  Choosing to treat or not to treat menopausal changes is an individual decision that you make with your health care provider. What should I know about hormone replacement therapy and supplements? Hormone therapy products are effective for treating symptoms that are associated with menopause, such as hot flashes and night sweats. Hormone replacement carries  certain risks, especially as you become older. If you are thinking about using estrogen or estrogen with progestin treatments, discuss the benefits and risks with your health care provider. What should I know about heart disease and stroke? Heart disease, heart attack, and stroke become more likely as you age. This may be due, in part, to the hormonal changes that your body experiences during menopause. These can affect how your body processes dietary fats, triglycerides, and cholesterol. Heart attack and stroke are both medical emergencies. There are many things that you can do to help prevent heart disease and stroke:  Have your blood pressure checked at least every 1-2 years. High blood pressure causes heart disease and increases the risk of stroke.  If you are 13-54 years old, ask your health care provider if you should take aspirin to prevent a heart attack or a stroke.  Do not use any tobacco products, including cigarettes, chewing tobacco, or electronic cigarettes. If you need help quitting, ask your health care provider.  It is important to eat a healthy diet and maintain a healthy weight. ? Be sure to include plenty of vegetables, fruits, low-fat dairy products, and lean protein. ? Avoid eating foods that are high in solid fats, added sugars, or salt (sodium).  Get regular exercise. This is one of the most important things that you can do for your health. ? Try to exercise for at least 150 minutes each week. The type of exercise that you do should increase your heart rate and make you sweat. This is known as moderate-intensity exercise. ? Try to do strengthening exercises at least twice each week.  Do these in addition to the moderate-intensity exercise.  Know your numbers.Ask your health care provider to check your cholesterol and your blood glucose. Continue to have your blood tested as directed by your health care provider.  What should I know about cancer screening? There are  several types of cancer. Take the following steps to reduce your risk and to catch any cancer development as early as possible. Breast Cancer  Practice breast self-awareness. ? This means understanding how your breasts normally appear and feel. ? It also means doing regular breast self-exams. Let your health care provider know about any changes, no matter how small.  If you are 69 or older, have a clinician do a breast exam (clinical breast exam or CBE) every year. Depending on your age, family history, and medical history, it may be recommended that you also have a yearly breast X-ray (mammogram).  If you have a family history of breast cancer, talk with your health care provider about genetic screening.  If you are at high risk for breast cancer, talk with your health care provider about having an MRI and a mammogram every year.  Breast cancer (BRCA) gene test is recommended for women who have family members with BRCA-related cancers. Results of the assessment will determine the need for genetic counseling and BRCA1 and for BRCA2 testing. BRCA-related cancers include these types: ? Breast. This occurs in males or females. ? Ovarian. ? Tubal. This may also be called fallopian tube cancer. ? Cancer of the abdominal or pelvic lining (peritoneal cancer). ? Prostate. ? Pancreatic.  Cervical, Uterine, and Ovarian Cancer Your health care provider may recommend that you be screened regularly for cancer of the pelvic organs. These include your ovaries, uterus, and vagina. This screening involves a pelvic exam, which includes checking for microscopic changes to the surface of your cervix (Pap test).  For women ages 21-65, health care providers may recommend a pelvic exam and a Pap test every three years. For women ages 85-65, they may recommend the Pap test and pelvic exam, combined with testing for human papilloma virus (HPV), every five years. Some types of HPV increase your risk of cervical  cancer. Testing for HPV may also be done on women of any age who have unclear Pap test results.  Other health care providers may not recommend any screening for nonpregnant women who are considered low risk for pelvic cancer and have no symptoms. Ask your health care provider if a screening pelvic exam is right for you.  If you have had past treatment for cervical cancer or a condition that could lead to cancer, you need Pap tests and screening for cancer for at least 20 years after your treatment. If Pap tests have been discontinued for you, your risk factors (such as having a new sexual partner) need to be reassessed to determine if you should start having screenings again. Some women have medical problems that increase the chance of getting cervical cancer. In these cases, your health care provider may recommend that you have screening and Pap tests more often.  If you have a family history of uterine cancer or ovarian cancer, talk with your health care provider about genetic screening.  If you have vaginal bleeding after reaching menopause, tell your health care provider.  There are currently no reliable tests available to screen for ovarian cancer.  Lung Cancer Lung cancer screening is recommended for adults 2-59 years old who are at high risk for lung cancer because of a history  of smoking. A yearly low-dose CT scan of the lungs is recommended if you:  Currently smoke.  Have a history of at least 30 pack-years of smoking and you currently smoke or have quit within the past 15 years. A pack-year is smoking an average of one pack of cigarettes per day for one year.  Yearly screening should:  Continue until it has been 15 years since you quit.  Stop if you develop a health problem that would prevent you from having lung cancer treatment.  Colorectal Cancer  This type of cancer can be detected and can often be prevented.  Routine colorectal cancer screening usually begins at age 80  and continues through age 76.  If you have risk factors for colon cancer, your health care provider may recommend that you be screened at an earlier age.  If you have a family history of colorectal cancer, talk with your health care provider about genetic screening.  Your health care provider may also recommend using home test kits to check for hidden blood in your stool.  A small camera at the end of a tube can be used to examine your colon directly (sigmoidoscopy or colonoscopy). This is done to check for the earliest forms of colorectal cancer.  Direct examination of the colon should be repeated every 5-10 years until age 46. However, if early forms of precancerous polyps or small growths are found or if you have a family history or genetic risk for colorectal cancer, you may need to be screened more often.  Skin Cancer  Check your skin from head to toe regularly.  Monitor any moles. Be sure to tell your health care provider: ? About any new moles or changes in moles, especially if there is a change in a mole's shape or color. ? If you have a mole that is larger than the size of a pencil eraser.  If any of your family members has a history of skin cancer, especially at a young age, talk with your health care provider about genetic screening.  Always use sunscreen. Apply sunscreen liberally and repeatedly throughout the day.  Whenever you are outside, protect yourself by wearing long sleeves, pants, a wide-brimmed hat, and sunglasses.  What should I know about osteoporosis? Osteoporosis is a condition in which bone destruction happens more quickly than new bone creation. After menopause, you may be at an increased risk for osteoporosis. To help prevent osteoporosis or the bone fractures that can happen because of osteoporosis, the following is recommended:  If you are 53-29 years old, get at least 1,000 mg of calcium and at least 600 mg of vitamin D per day.  If you are older than  age 42 but younger than age 48, get at least 1,200 mg of calcium and at least 600 mg of vitamin D per day.  If you are older than age 25, get at least 1,200 mg of calcium and at least 800 mg of vitamin D per day.  Smoking and excessive alcohol intake increase the risk of osteoporosis. Eat foods that are rich in calcium and vitamin D, and do weight-bearing exercises several times each week as directed by your health care provider. What should I know about how menopause affects my mental health? Depression may occur at any age, but it is more common as you become older. Common symptoms of depression include:  Low or sad mood.  Changes in sleep patterns.  Changes in appetite or eating patterns.  Feeling an overall lack  of motivation or enjoyment of activities that you previously enjoyed.  Frequent crying spells.  Talk with your health care provider if you think that you are experiencing depression. What should I know about immunizations? It is important that you get and maintain your immunizations. These include:  Tetanus, diphtheria, and pertussis (Tdap) booster vaccine.  Influenza every year before the flu season begins.  Pneumonia vaccine.  Shingles vaccine.  Your health care provider may also recommend other immunizations. This information is not intended to replace advice given to you by your health care provider. Make sure you discuss any questions you have with your health care provider. Document Released: 03/31/2005 Document Revised: 08/27/2015 Document Reviewed: 11/10/2014 Elsevier Interactive Patient Education  2018 [employer].

## 2016-11-13 ENCOUNTER — Ambulatory Visit (INDEPENDENT_AMBULATORY_CARE_PROVIDER_SITE_OTHER): Payer: BC Managed Care – PPO

## 2016-11-13 ENCOUNTER — Ambulatory Visit (INDEPENDENT_AMBULATORY_CARE_PROVIDER_SITE_OTHER): Payer: BC Managed Care – PPO | Admitting: Obstetrics & Gynecology

## 2016-11-13 DIAGNOSIS — N83201 Unspecified ovarian cyst, right side: Secondary | ICD-10-CM

## 2016-11-13 DIAGNOSIS — Z23 Encounter for immunization: Secondary | ICD-10-CM

## 2016-11-13 NOTE — Progress Notes (Signed)
    VERTIE DIBBERN May 23, 1955 [id number]        61 y.o.  G3P3   RP:  Pelvic US f/u small simple Rt ovarian cyst  HPI:   No pelvic pain.  Annual/Gyn exam done 10/31/2016.  Last year Pelvic US stable small benign appearing Rt ovarian cyst with Ca125 normal at 27.  Past medical history,surgical history, problem list, medications, allergies, family history and social history were all reviewed and documented in the EPIC chart.  Directed ROS with pertinent positives and negatives documented in the history of present illness/assessment and plan.  Exam:  There were no vitals filed for this visit. General appearance:  Normal  Ca125 stable at 25 on 10/2016 (was 27 last year)  Pelvic US today:  T/V Anteverted uterus 6.21 x 4.40 x 2.73 cm with a thin Endometrial line at 1.3 mm.  Homogeneous myometrium.  Right ovary with a thick wall cyst/irregular lumen at 1.0 x 0.9 cm.  No significant change in size x last year.  Neg CFD.  Blood flow to Right ovary normal.  Left ovary normal.  No FF in CDS.   Assessment/Plan:  61 y.o. G3P3   1. Flu vaccine need  - Flu Vaccine QUAD 36+ mos IM (Fluarix, Quad PF)  2. Cyst of right ovary Very small, stable, benign appearing cyst with normal Ca 125.  Reassured and recommend observation only.  F/U Annual/Gyn exam in 1 year.  Counseling on above issue >50% x 15 minutes.  Princess Bruins MD, 11:45 AM 11/13/2016

## 2016-11-18 NOTE — Patient Instructions (Signed)
1. Flu vaccine need  - Flu Vaccine QUAD 36+ mos IM (Fluarix, Quad PF)  2. Cyst of right ovary Very small, stable, benign appearing cyst with normal Ca 125.  Reassured and recommend observation only.  F/U Annual/Gyn exam in 1 year.  Good to see you today Sherri Rangel!

## 2017-05-22 ENCOUNTER — Ambulatory Visit: Payer: BC Managed Care – PPO | Admitting: Medical

## 2017-05-22 ENCOUNTER — Encounter: Payer: Self-pay | Admitting: Medical

## 2017-05-22 VITALS — BP 110/72 | HR 75 | Temp 99.6°F | Ht 68.0 in | Wt 151.8 lb

## 2017-05-22 DIAGNOSIS — J0100 Acute maxillary sinusitis, unspecified: Secondary | ICD-10-CM | POA: Diagnosis not present

## 2017-05-22 MED ORDER — AMOXICILLIN 500 MG PO TABS: ORAL_TABLET | ORAL | 0 refills | Status: DC

## 2017-05-22 NOTE — Progress Notes (Signed)
Subjective:  Sherri Rangel is a 62 y.o. female who presents for  Chief Complaint  Patient presents with  . Acute Visit    facial congestion/pressure, left ear stopped up, possible fever, taking OTC medication    Symptoms include 5-6 day hx/o sinus pressure, fullness/swollen feeling in sinuses, balance has been a little off, some sneezing ,left nostril stopped up, mucous drainage. Denies fever, NVD, no sore throat, no cough.  Using loratadine and mucinex for symptoms. reports sick contacts.  Patient is not a smoker. No other aggravating or relieving factors.  No other complaint.    Past Medical History:  Diagnosis Date  . Anemia    normal CBC 08/2014  . BCC (basal cell carcinoma)    right eye, LLE  . Colon polyp 08/2009   WNL, internal hemorrhoid (Dr. Earlean Shawl); also had colonoscopy 2008 with Dr. Carlean Purl  . Encounter for routine gynecological examination    sees Dr. Uvaldo Rising  . H/O bone density study 11/2013   normal  . Hyperlipidemia   . Hypertension   . Iron deficiency 06/2009   negative EGD and colonoscopy 08/2009  . Menopausal state   . Vaginal atrophy     Current Outpatient Medications on File Prior to Visit  Medication Sig Dispense Refill  . aspirin 81 MG tablet Take 1 tablet (81 mg total) by mouth daily. 90 tablet 3  . Coenzyme Q10 (CO Q 10 PO) Take by mouth.    . cycloSPORINE (RESTASIS) 0.05 % ophthalmic emulsion 1 drop 2 (two) times daily.    Marland Kitchen lisinopril (PRINIVIL,ZESTRIL) 5 MG tablet 1 tablet po daily 90 tablet 3  . Multiple Vitamin (MULTIVITAMIN) tablet Take 1 tablet by mouth daily.      Marland Kitchen RA KRILL OIL PO Take 750 mg by mouth 2 (two) times daily.     No current facility-administered medications on file prior to visit.     ROS as in subjective   Objective: BP 110/72 (BP Location: Right Arm, Patient Position: Sitting, Cuff Size: Normal)   Pulse 75   Temp 99.6 F (37.6 C) (Oral)   Ht 5\' 8"  (1.727 m)   Wt 151 lb 12.8 oz (68.9 kg)   SpO2 98%   BMI  23.08 kg/m   General appearance: Alert, well developed, well nourished, no distress                             Skin: warm, no rash                           Head: +maxillary sinus tenderness,                            Eyes: conjunctiva pink, corneas clear                            Ears: flat left tympanic membrane, flat right tympanic membrane, external ear canals normal                          Nose: septum midline, turbinates swollen, with erythema and clear discharge, L>R             Mouth/throat: MMM, tongue normal, mild pharyngeal erythema  Neck: supple, no adenopathy, no thyromegaly, non tender                         Lungs: clear, no wheezes, no rales, no rhonchi        Assessment  Encounter Diagnosis  Name Primary?  . Acute non-recurrent maxillary sinusitis Yes      Plan: Discussed diagnosis of sinusitis.   Discussed usual time frame to see improvement. Discussed possible complications or symptoms that would prompt call back or recheck within the next few days.     Medications prescribed:  Complete the course of Amoxicillin antibiotic prescribed today.   Specific home care recommendations discussed:  Decongestant: You may use OTC Guaifenesin (Mucinex plain) for congestion.  You may use Pseudoephedrine (Sudafed) only if you don't have blood pressure problems or a diagnosis of hypertension.  Cough suppression: If you have cough, you may use over-the-counter Dextromethorphan (Delsym) as directed on the label  Pain/fever relief: You may use over-the-counter Tylenol for pain or fever  Drink extra fluids. Fluids help thin the mucus so your sinuses can drain more easily.   Applying either moist heat or ice packs to the sinus areas may help relieve discomfort.  Use saline nasal sprays to help moisten your sinuses. The sprays can be found at your local drugstore.   [name] was seen today for acute visit.  Diagnoses and all orders for this  visit:  Acute non-recurrent maxillary sinusitis  Other orders -     amoxicillin (AMOXIL) 500 MG tablet; 2 tablets po BID x 10 days   Patient was advised to call or return if worse or not improving in the next few days.    Patient voiced understanding of diagnosis, recommendations, and treatment plan.

## 2017-06-18 ENCOUNTER — Other Ambulatory Visit: Payer: Self-pay | Admitting: Medical

## 2017-06-18 DIAGNOSIS — E785 Hyperlipidemia, unspecified: Secondary | ICD-10-CM

## 2017-06-18 DIAGNOSIS — I10 Essential (primary) hypertension: Secondary | ICD-10-CM

## 2017-11-01 ENCOUNTER — Encounter: Payer: BC Managed Care – PPO | Admitting: Obstetrics & Gynecology

## 2017-11-01 LAB — HM MAMMOGRAPHY

## 2017-11-23 ENCOUNTER — Encounter: Payer: Self-pay | Admitting: Obstetrics & Gynecology

## 2017-11-23 ENCOUNTER — Ambulatory Visit: Payer: BC Managed Care – PPO | Admitting: Obstetrics & Gynecology

## 2017-11-23 VITALS — BP 126/84 | Ht 67.75 in | Wt 147.0 lb

## 2017-11-23 DIAGNOSIS — Z01419 Encounter for gynecological examination (general) (routine) without abnormal findings: Secondary | ICD-10-CM | POA: Diagnosis not present

## 2017-11-23 DIAGNOSIS — Z780 Asymptomatic menopausal state: Secondary | ICD-10-CM

## 2017-11-23 DIAGNOSIS — Z23 Encounter for immunization: Secondary | ICD-10-CM | POA: Diagnosis not present

## 2017-11-23 DIAGNOSIS — Z8041 Family history of malignant neoplasm of ovary: Secondary | ICD-10-CM

## 2017-11-23 NOTE — Patient Instructions (Signed)
1. Well female exam with routine gynecological exam Normal gynecologic exam.  Pap negative in September 2018.  No indication to repeat Pap this year.  Breast exam normal.  Screening mammogram was negative in September 2019.  Health labs with family physician.  Colonoscopy in 2016.  Body mass index 22.52.  Continue with regular fitness activities.  2. Postmenopausal Well on no hormone replacement therapy.  No postmenopausal bleeding.  Bone density normal in October 2017.  Will repeat at 5 years.  Vitamin D supplements, calcium intake of 1.5 g a day and regular weightbearing physical activities.  3. Family history of ovarian cancer Mother with ovarian cancer in her 57s.  Will do a Ca1 25 this year and alternate with a pelvic ultrasound next year. - CA 125  Jamylah, it was a pleasure seeing you today!  I will inform you of your results as soon as they are available.

## 2017-11-23 NOTE — Progress Notes (Signed)
Sherri Rangel [birth year] [id number]   History:    62 y.o. G3P3L3 Married.  Retired but Civil engineer, contracting as a Art therapist.  RP:  Established patient presenting for annual gyn exam   HPI: Menopausal, well on no hormone replacement therapy.  No postmenopausal bleeding.  No pelvic pain.  No pain with intercourse.  Urine and bowel movements normal.  Breasts normal currently.  Screening mammogram negative in September 2019.  Body mass index 22.52.  Regular fitness activities.  Health labs with family physician.  Past medical history,surgical history, family history and social history were all reviewed and documented in the EPIC chart.  Gynecologic History No LMP recorded. Patient is postmenopausal. Contraception: post menopausal status Last Pap: 10/2016. Results were: Negative Last mammogram: 10/2017. Results were: Negative Bone Density: 11/2015 Normal Colonoscopy: 2016  Obstetric History OB History  Gravida Para Term Preterm AB Living  3 3       3   SAB TAB Ectopic Multiple Live Births               # Outcome Date GA Lbr Len/2nd Weight Sex Delivery Anes PTL Lv  3 Para           2 Para           1 Para              ROS: A ROS was performed and pertinent positives and negatives are included in the history.  GENERAL: No fevers or chills. HEENT: No change in vision, no earache, sore throat or sinus congestion. NECK: No pain or stiffness. CARDIOVASCULAR: No chest pain or pressure. No palpitations. PULMONARY: No shortness of breath, cough or wheeze. GASTROINTESTINAL: No abdominal pain, nausea, vomiting or diarrhea, melena or bright red blood per rectum. GENITOURINARY: No urinary frequency, urgency, hesitancy or dysuria. MUSCULOSKELETAL: No joint or muscle pain, no back pain, no recent trauma. DERMATOLOGIC: No rash, no itching, no lesions. ENDOCRINE: No polyuria, polydipsia, no heat or cold intolerance. No recent change in weight. HEMATOLOGICAL: No anemia or easy bruising or bleeding. NEUROLOGIC: No  headache, seizures, numbness, tingling or weakness. PSYCHIATRIC: No depression, no loss of interest in normal activity or change in sleep pattern.     Exam:   BP 126/84   Ht 5' 7.75" (1.721 m)   Wt 147 lb (66.7 kg)   BMI 22.52 kg/m   Body mass index is 22.52 kg/m.  General appearance : Well developed well nourished female. No acute distress HEENT: Eyes: no retinal hemorrhage or exudates,  Neck supple, trachea midline, no carotid bruits, no thyroidmegaly Lungs: Clear to auscultation, no rhonchi or wheezes, or rib retractions  Heart: Regular rate and rhythm, no murmurs or gallops Breast:Examined in sitting and supine position were symmetrical in appearance, no palpable masses or tenderness,  no skin retraction, no nipple inversion, no nipple discharge, no skin discoloration, no axillary or supraclavicular lymphadenopathy Abdomen: no palpable masses or tenderness, no rebound or guarding Extremities: no edema or skin discoloration or tenderness  Pelvic: Vulva: Normal             Vagina: No gross lesions or discharge  Cervix: No gross lesions or discharge  Uterus  AV, normal size, shape and consistency, non-tender and mobile  Adnexa  Without masses or tenderness  Anus: Normal   Assessment/Plan:  62 y.o. female for annual exam   1. Well female exam with routine gynecological exam Normal gynecologic exam.  Pap negative in September 2018.  No indication to repeat Pap  this year.  Breast exam normal.  Screening mammogram was negative in September 2019.  Health labs with family physician.  Colonoscopy in 2016.  Body mass index 22.52.  Continue with regular fitness activities.  2. Postmenopausal Well on no hormone replacement therapy.  No postmenopausal bleeding.  Bone density normal in October 2017.  Will repeat at 5 years.  Vitamin D supplements, calcium intake of 1.5 g a day and regular weightbearing physical activities.  3. Family history of ovarian cancer Mother with ovarian cancer  in her 10s.  Will do a Ca1 25 this year and alternate with a pelvic ultrasound next year. - CA 125  Princess Bruins MD, 11:53 AM 11/23/2017

## 2017-11-26 LAB — CA 125: CA 125: 26 U/mL (ref <35)

## 2017-12-21 ENCOUNTER — Other Ambulatory Visit: Payer: Self-pay | Admitting: Medical

## 2017-12-21 DIAGNOSIS — I10 Essential (primary) hypertension: Secondary | ICD-10-CM

## 2017-12-21 DIAGNOSIS — E785 Hyperlipidemia, unspecified: Secondary | ICD-10-CM

## 2017-12-21 NOTE — Telephone Encounter (Signed)
Is this ok to refill?  

## 2018-06-28 ENCOUNTER — Other Ambulatory Visit: Payer: Self-pay | Admitting: Medical

## 2018-06-28 ENCOUNTER — Telehealth: Payer: Self-pay | Admitting: Medical

## 2018-06-28 DIAGNOSIS — E785 Hyperlipidemia, unspecified: Secondary | ICD-10-CM

## 2018-06-28 DIAGNOSIS — I10 Essential (primary) hypertension: Secondary | ICD-10-CM

## 2018-06-28 NOTE — Telephone Encounter (Signed)
Yes continue routine BP medications, unless she is having side effects or unless its not working.   I'm not sure what her concern is?   Is her BPs running good?

## 2018-06-28 NOTE — Telephone Encounter (Signed)
Pt called and is wanting to know if she needs to continue taking lisinopril with the covid going around, is this a safe drug to be taking,or is there something safer she can be taking, states having high blood pressure puts her at higher risk, pt can be reached at [phone]

## 2018-07-01 NOTE — Telephone Encounter (Signed)
Patient notified to keep taking Lisinopril.  She states that she needs refill but refills were sent in on 06-28-18.

## 2018-11-07 LAB — HM MAMMOGRAPHY

## 2018-11-12 ENCOUNTER — Encounter: Payer: Self-pay | Admitting: Medical

## 2018-11-28 ENCOUNTER — Encounter: Payer: BC Managed Care – PPO | Admitting: Obstetrics & Gynecology

## 2018-12-04 ENCOUNTER — Other Ambulatory Visit: Payer: Self-pay

## 2018-12-05 ENCOUNTER — Encounter: Payer: Self-pay | Admitting: Obstetrics & Gynecology

## 2018-12-05 ENCOUNTER — Ambulatory Visit: Payer: Medicare Other | Admitting: Obstetrics & Gynecology

## 2018-12-05 VITALS — BP 140/80 | Ht 67.0 in | Wt 148.0 lb

## 2018-12-05 DIAGNOSIS — Z23 Encounter for immunization: Secondary | ICD-10-CM

## 2018-12-05 DIAGNOSIS — Z780 Asymptomatic menopausal state: Secondary | ICD-10-CM

## 2018-12-05 DIAGNOSIS — Z8041 Family history of malignant neoplasm of ovary: Secondary | ICD-10-CM | POA: Diagnosis not present

## 2018-12-05 DIAGNOSIS — Z01419 Encounter for gynecological examination (general) (routine) without abnormal findings: Secondary | ICD-10-CM | POA: Diagnosis not present

## 2018-12-05 NOTE — Progress Notes (Signed)
Sherri Rangel 1955-11-11 [id number]   History:    63 y.o. G3P3L3 Married.  Retired but Civil engineer, contracting as a Art therapist.  RP:  Established patient presenting for annual gyn exam   HPI: Menopausal, well on no hormone replacement therapy.  No postmenopausal bleeding.  No pelvic pain.  No pain with intercourse.  Urine and bowel movements normal.  Breasts normal currently.  Screening mammogram benign in September 2020.  Body mass index 23.18.  Regular fitness activities.  Health labs with family physician.  Past medical history,surgical history, family history and social history were all reviewed and documented in the EPIC chart.  Gynecologic History No LMP recorded. Patient is postmenopausal. Contraception: post menopausal status Last Pap: 10/2016. Results were: Negative Last mammogram: 10/2018. Results were: Benign Bone Density: 11/2015 Normal Colonoscopy: 09/2014  Obstetric History OB History  Gravida Para Term Preterm AB Living  3 3       3   SAB TAB Ectopic Multiple Live Births               # Outcome Date GA Lbr Len/2nd Weight Sex Delivery Anes PTL Lv  3 Para           2 Para           1 Para              ROS: A ROS was performed and pertinent positives and negatives are included in the history.  GENERAL: No fevers or chills. HEENT: No change in vision, no earache, sore throat or sinus congestion. NECK: No pain or stiffness. CARDIOVASCULAR: No chest pain or pressure. No palpitations. PULMONARY: No shortness of breath, cough or wheeze. GASTROINTESTINAL: No abdominal pain, nausea, vomiting or diarrhea, melena or bright red blood per rectum. GENITOURINARY: No urinary frequency, urgency, hesitancy or dysuria. MUSCULOSKELETAL: No joint or muscle pain, no back pain, no recent trauma. DERMATOLOGIC: No rash, no itching, no lesions. ENDOCRINE: No polyuria, polydipsia, no heat or cold intolerance. No recent change in weight. HEMATOLOGICAL: No anemia or easy bruising or bleeding. NEUROLOGIC: No  headache, seizures, numbness, tingling or weakness. PSYCHIATRIC: No depression, no loss of interest in normal activity or change in sleep pattern.     Exam:   BP 140/80 (BP Location: Right Arm, Patient Position: Sitting, Cuff Size: Normal)   Ht 5\' 7"  (1.702 m)   Wt 148 lb (67.1 kg)   BMI 23.18 kg/m   Body mass index is 23.18 kg/m.  General appearance : Well developed well nourished female. No acute distress HEENT: Eyes: no retinal hemorrhage or exudates,  Neck supple, trachea midline, no carotid bruits, no thyroidmegaly Lungs: Clear to auscultation, no rhonchi or wheezes, or rib retractions  Heart: Regular rate and rhythm, no murmurs or gallops Breast:Examined in sitting and supine position were symmetrical in appearance, no palpable masses or tenderness,  no skin retraction, no nipple inversion, no nipple discharge, no skin discoloration, no axillary or supraclavicular lymphadenopathy Abdomen: no palpable masses or tenderness, no rebound or guarding Extremities: no edema or skin discoloration or tenderness  Pelvic: Vulva: Normal             Vagina: No gross lesions or discharge  Cervix: No gross lesions or discharge  Uterus  AV, normal size, shape and consistency, non-tender and mobile  Adnexa  Without masses or tenderness  Anus: Normal   Assessment/Plan:  63 y.o. female for annual exam   1. Well female exam with routine gynecological exam Normal gynecologic exam.  Last  Pap test September 2018 was negative, no indication to repeat this year.  Breast exam normal.  Screening mammogram September 2020 was benign.  Colonoscopy in 2016.  Good body mass index at 23.18.  Continue with fitness and healthy nutrition.  Health labs with family physician.  2. Postmenopausal Well on no hormone replacement therapy.  No postmenopausal bleeding.  Bone density normal in October 2017.  Continue vitamin D supplements, calcium intake of 1200 mg daily and regular weightbearing physical activities.   3. Family history of ovarian cancer No pelvic pain and normal gynecologic exam.  Ca125 was normal and stable October 2019.  4. Need for immunization against influenza - Flu Vaccine QUAD High Dose(Fluad)  Other orders - Omega-3 Fatty Acids (FISH OIL PO); Take 1 tablet by mouth daily.  Princess Bruins MD, 12:30 PM 12/05/2018

## 2018-12-13 ENCOUNTER — Encounter: Payer: Self-pay | Admitting: Obstetrics & Gynecology

## 2018-12-13 NOTE — Patient Instructions (Signed)
1. Well female exam with routine gynecological exam Normal gynecologic exam.  Last Pap test September 2018 was negative, no indication to repeat this year.  Breast exam normal.  Screening mammogram September 2020 was benign.  Colonoscopy in 2016.  Good body mass index at 23.18.  Continue with fitness and healthy nutrition.  Health labs with family physician.  2. Postmenopausal Well on no hormone replacement therapy.  No postmenopausal bleeding.  Bone density normal in October 2017.  Continue vitamin D supplements, calcium intake of 1200 mg daily and regular weightbearing physical activities.  3. Family history of ovarian cancer No pelvic pain and normal gynecologic exam.  Ca125 was normal and stable October 2019.  4. Need for immunization against influenza - Flu Vaccine QUAD High Dose(Fluad)  Other orders - Omega-3 Fatty Acids (FISH OIL PO); Take 1 tablet by mouth daily.  Sherri Rangel, it was a pleasure seeing you today!

## 2018-12-18 ENCOUNTER — Telehealth: Payer: Self-pay

## 2018-12-18 NOTE — Telephone Encounter (Signed)
Pt. Called stating that she needs a refill on her Lisinopril last filled 06/28/18, pt. Was last seen 06/19/16 so pt. Said she may need an apt. Since it's been a while since she has been seen, pt. Also stated she is now on Medicare. Pt. Uses [facility] [place]

## 2018-12-18 NOTE — Telephone Encounter (Signed)
Please schedule CPX and send 30 day supply of the medication

## 2018-12-19 ENCOUNTER — Other Ambulatory Visit: Payer: Self-pay

## 2018-12-19 DIAGNOSIS — E785 Hyperlipidemia, unspecified: Secondary | ICD-10-CM

## 2018-12-19 DIAGNOSIS — I10 Essential (primary) hypertension: Secondary | ICD-10-CM

## 2018-12-19 MED ORDER — LISINOPRIL 5 MG PO TABS: 5.0000 mg | ORAL_TABLET | Freq: Every day | ORAL | 0 refills | Status: DC

## 2018-12-19 NOTE — Telephone Encounter (Signed)
Pt. Has been scheduled for CPE on 01/09/19 and medication has been refilled for 30 day supply.

## 2019-01-09 ENCOUNTER — Ambulatory Visit: Payer: Medicare Other | Admitting: Medical

## 2019-01-09 ENCOUNTER — Other Ambulatory Visit: Payer: Self-pay

## 2019-01-09 ENCOUNTER — Encounter: Payer: Self-pay | Admitting: Medical

## 2019-01-09 VITALS — BP 124/70 | HR 69 | Temp 96.8°F | Ht 67.0 in | Wt 150.8 lb

## 2019-01-09 DIAGNOSIS — Z780 Asymptomatic menopausal state: Secondary | ICD-10-CM | POA: Diagnosis not present

## 2019-01-09 DIAGNOSIS — Z7185 Encounter for immunization safety counseling: Secondary | ICD-10-CM

## 2019-01-09 DIAGNOSIS — Z23 Encounter for immunization: Secondary | ICD-10-CM | POA: Diagnosis not present

## 2019-01-09 DIAGNOSIS — Z0000 Encounter for general adult medical examination without abnormal findings: Secondary | ICD-10-CM

## 2019-01-09 DIAGNOSIS — E785 Hyperlipidemia, unspecified: Secondary | ICD-10-CM | POA: Diagnosis not present

## 2019-01-09 DIAGNOSIS — Z136 Encounter for screening for cardiovascular disorders: Secondary | ICD-10-CM | POA: Insufficient documentation

## 2019-01-09 DIAGNOSIS — N952 Postmenopausal atrophic vaginitis: Secondary | ICD-10-CM

## 2019-01-09 DIAGNOSIS — I10 Essential (primary) hypertension: Secondary | ICD-10-CM | POA: Diagnosis not present

## 2019-01-09 DIAGNOSIS — Z86010 Personal history of colonic polyps: Secondary | ICD-10-CM | POA: Insufficient documentation

## 2019-01-09 DIAGNOSIS — Z7189 Other specified counseling: Secondary | ICD-10-CM

## 2019-01-09 DIAGNOSIS — Z8041 Family history of malignant neoplasm of ovary: Secondary | ICD-10-CM

## 2019-01-09 NOTE — Progress Notes (Addendum)
Subjective:    Sherri Rangel is a 63 y.o. female who presents for Preventative Services visit and chronic medical problems/med check visit.    Primary Care Provider Tysinger, Camelia Eng, PA-C here for primary care  Current Health Care Team:  Dentist, Dr. Evelene Croon  Eye doctor, Dr. Jerline Pain  Dr. Princess Bruins, gynecology  Dr. Richmond Campbell, GI  Dr. [place] Crane, dermatology   Medical Services you may have received from other than [facility] providers in the past year (date may be approximate) gyn  Exercise Current exercise habits: exercise at home; walk, pickle ball and golf 7 days a week for at least an hour.  Nutrition/Diet Current diet: well balanced  Depression Screen Depression screen PHQ 2/9 01/09/2019  Decreased Interest 0  Down, Depressed, Hopeless 0  PHQ - 2 Score 0    Activities of Daily Living Screen/Functional Status Survey Is the patient deaf or have difficulty hearing?: No Does the patient have difficulty seeing, even when wearing glasses/contacts?: No Does the patient have difficulty concentrating, remembering, or making decisions?: No Does the patient have difficulty walking or climbing stairs?: No Does the patient have difficulty dressing or bathing?: No Does the patient have difficulty doing errands alone such as visiting a doctor's office or shopping?: No  Can patient draw a clock face showing 3:15 oclock, yes  Fall Risk Screen Fall Risk  01/09/2019  Falls in the past year? 0    Gait Assessment: Normal gait observed - yes  Advanced directives Does patient have a [place]? No Does patient have a Living Will? No  Past Medical History:  Diagnosis Date  . Anemia    prior to 2016  . BCC (basal cell carcinoma)    right eye, LLE  . Colon polyp 08/2009   WNL, internal hemorrhoid (Dr. Earlean Shawl); also had colonoscopy 2008 with Dr. Carlean Purl  . Encounter for routine gynecological examination    sees Dr. Uvaldo Rising  . H/O bone density  study    normal 2017, 2015  . Hyperlipidemia   . Hypertension   . Iron deficiency 06/2009   negative EGD and colonoscopy 08/2009  . Menopausal state   . Vaginal atrophy     Past Surgical History:  Procedure Laterality Date  . BREAST SURGERY     RIGHT BREAST BIOPSY/FCD  . COLONOSCOPY  09/2014   Dr. Earlean Shawl, prior 2011.   due back 2021, hx/o polyps  . skin tags      Social History   Socioeconomic History  . Marital status: Married    Spouse name: Not on file  . Number of children: Not on file  . Years of education: Not on file  . Highest education level: Not on file  Occupational History  . Occupation: Art therapist at [employer]  . Financial resource strain: Not on file  . Food insecurity    Worry: Not on file    Inability: Not on file  . Transportation needs    Medical: Not on file    Non-medical: Not on file  Tobacco Use  . Smoking status: Never Smoker  . Smokeless tobacco: Never Used  Substance and Sexual Activity  . Alcohol use: Yes    Alcohol/week: 1.0 standard drinks    Types: 1 Standard drinks or equivalent per week    Comment: 4 drinks in a month  . Drug use: No  . Sexual activity: Yes    Birth control/protection: Post-menopausal  Comment: exercise - step aerobics, some weights.  Teaches elementary music.  Married.  Lifestyle  . Physical activity    Days per week: Not on file    Minutes per session: Not on file  . Stress: Not on file  Relationships  . Social Herbalist on phone: Not on file    Gets together: Not on file    Attends religious service: Not on file    Active member of club or organization: Not on file    Attends meetings of clubs or organizations: Not on file    Relationship status: Not on file  . Intimate partner violence    Fear of current or ex partner: Not on file    Emotionally abused: Not on file    Physically abused: Not on file    Forced sexual activity:  Not on file  Other Topics Concern  . 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   Has 3 children, 2 grandchildren [place] and [place] , that live in [place], [place].  Exercise - does group exercise, step aerobics, weights, eating healthy.  12/2018    Family History  Problem Relation Age of Onset  . Cancer Mother 40       ovarian cancer  . Hypertension Mother   . Osteoporosis Mother   . Cancer Father        esophageal  . Alcohol abuse Father   . Hyperlipidemia Father   . Cancer Brother        MULTIPLE MYELOMA  . Diabetes Maternal Aunt   . Diabetes Cousin   . Diabetes Cousin   . Heart disease Paternal Grandfather      Current Outpatient Medications:  .  Coenzyme Q10 (CO Q 10 PO), Take by mouth., Disp: , Rfl:  .  cycloSPORINE (RESTASIS) 0.05 % ophthalmic emulsion, 1 drop 2 (two) times daily., Disp: , Rfl:  .  lisinopril (ZESTRIL) 5 MG tablet, Take 1 tablet (5 mg total) by mouth daily., Disp: 30 tablet, Rfl: 0 .  Multiple Vitamin (MULTIVITAMIN) tablet, Take 1 tablet by mouth daily.  , Disp: , Rfl:  .  Omega-3 Fatty Acids (FISH OIL PO), Take 1 tablet by mouth daily., Disp: , Rfl:  .  RA KRILL OIL PO, Take 750 mg by mouth 2 (two) times daily., Disp: , Rfl:  .  Turmeric 500 MG CAPS, Take by mouth., Disp: , Rfl:   No Known Allergies  History reviewed: allergies, current medications, past family history, past medical history, past social history, past surgical history and problem list  Chronic issues discussed: Compliant with medications, no issues  Acute issues discussed: None  Walking daily, pickle ball, some golf, walks 4-6 miles regularly.  Sometimes gets weight bearing exercise.    Objective:      Biometrics BP 124/70   Pulse 69   Temp (!) 96.8 F (36 C)   Ht _0  (1.702 m)   Wt 150 lb 12.8 oz (68.4 kg)   SpO2 97%   BMI 23.62 kg/m   Cognitive Testing  Alert? Yes  Normal Appearance?Yes   Oriented to person? Yes  Place? Yes   Time? Yes  Recall of three objects?  Yes  Can perform simple calculations? Yes  Displays appropriate judgment?Yes  Can read the correct time from a watch face?Yes  General appearance: alert, no distress, WD/WN, white female  Nutritional Status: Inadequate calore intake? no  Loss of muscle mass? yes Loss of fat beneath skin? no Localized or general edema? no Diminished functional status? no  Other pertinent exam: Skin: scattered macules, no worrisome lesions HEENT: normocephalic, sclerae anicteric, TMs pearly, nares patent, no discharge or erythema, pharynx normal Oral cavity: MMM, no lesions Neck: supple, no lymphadenopathy, no thyromegaly, no masses Heart: RRR, normal S1, S2, no murmurs Lungs: CTA bilaterally, no wheezes, rhonchi, or rales Abdomen: +bs, soft, non tender, non distended, no masses, no hepatomegaly, no splenomegaly Musculoskeletal: nontender, no swelling, no obvious deformity Extremities: no edema, no cyanosis, no clubbing Pulses: 2+ symmetric, upper and lower extremities, normal cap refill Neurological: alert, oriented x 3, CN2-12 intact, strength normal upper extremities and lower extremities, sensation normal throughout, DTRs 2+ throughout, no cerebellar signs, gait normal Psychiatric: normal affect, behavior normal, pleasant  Breast, gyn, rectal - deferred to gyn   Adult ECG Report  Indication: screen for heart disease  Rate: 57 bpm  Rhythm: sinus bradycardia  QRS Axis: -21 degrees  PR Interval: 178m  QRS Duration: 958m QTc: 43475mConduction Disturbances: none  Other Abnormalities: none  Patient's cardiac risk factors are: advanced age (older than 55 18r men, 65 90r women), dyslipidemia and hypertension.  EKG comparison: none  Narrative Interpretation: nonspecific T wave abnormality III, otherwise unremarkable EKG      Assessment:   Encounter Diagnoses  Name Primary?  . Essential hypertension, benign Yes   . Initial Medicare annual wellness visit   . Hyperlipidemia, unspecified hyperlipidemia type   . Post-menopausal   . Vaginal atrophy   . Family history of ovarian cancer   . History of colon polyps   . Screening for heart disease   . Vaccine counseling   . Need for pneumococcal vaccination      Plan:   A preventative services visit was completed today.  During the course of the visit today, we discussed and counseled about appropriate screening and preventive services.  A health risk assessment was established today that included a review of current medications, allergies, social history, family history, medical and preventative health history, biometrics, and preventative screenings to identify potential safety concerns or impairments.  A personalized plan was printed today for your records and use.   Personalized health advice and education was given today to reduce health risks and promote self management and wellness.  Information regarding end of life planning was discussed today.  Cancer screening: I reviewed 2016 colonoscopy, normal except internal hemorrhoids.  advised 5 year repeat given hx/o colon polyps  Reviewed 10/2018 normal mammogram  Pap - 2018 pap reviewed, normal.     Other screening Normal bone density test 2017.    Conditions/risks identified: none  Chronic problems discussed today: C/t current medications, labs today    Acute problems discussed today: none  Recommendations:  I recommend a yearly ophthalmology/optometry visit for glaucoma screening and eye checkup  I recommended a yearly dental visit for hygiene and checkup  Advanced directives - discussed nature and purpose of Advanced Directives, encouraged them to complete them if they have not done so and/or encouraged them to get us [place] if they have done this already.   Referrals today: none  Immunizations: Flu shot up to date 12/05/18  Shingles vaccine:  I recommend you have a  shingles vaccine to help prevent shingles or herpes zoster outbreak.   Please call your insurer to inquire about coverage for the Shingrix vaccine given in 2 doses.   Some insurers cover this vaccine  after age 69, some cover this after age 82.  If your insurer covers this, then call to schedule appointment to have this vaccine here.  Due for Td.  Counseled on vaccine.  She will check insurance coverage.  Counseled on the pneumococcal vaccine.  Vaccine information sheet given.  Pneumococcal vaccine Prevnar 13 given after consent obtained.  Leenah was seen today for [employer].  Diagnoses and all orders for this visit:  Essential hypertension, benign -     EKG 12-Lead -     Vitamin D, 25-hydroxy -     Comprehensive metabolic panel -     Lipid Panel  Initial Medicare annual wellness visit -     EKG 12-Lead  Hyperlipidemia, unspecified hyperlipidemia type -     Comprehensive metabolic panel -     Lipid Panel  Post-menopausal -     Vitamin D, 25-hydroxy -     CBC with Differential  Vaginal atrophy  Family history of ovarian cancer  History of colon polyps -     CBC with Differential  Screening for heart disease -     EKG 12-Lead  Vaccine counseling  Need for pneumococcal vaccination     Medicare Attestation A preventative services visit was completed today.  During the course of the visit the patient was educated and counseled about appropriate screening and preventive services.  A health risk assessment was established with the patient that included a review of current medications, allergies, social history, family history, medical and preventative health history, biometrics, and preventative screenings to identify potential safety concerns or impairments.  A personalized plan was printed today for the patient's records and use.   Personalized health advice and education was given today to reduce health risks and promote self management and wellness.  Information  regarding end of life planning was discussed today.  Sherri Ogle, PA-C   01/09/2019

## 2019-01-09 NOTE — Addendum Note (Signed)
Addended by: Carlena Hurl on: 01/09/2019 01:13 PM   Modules accepted: Orders

## 2019-01-09 NOTE — Patient Instructions (Signed)
Thanks for trusting [place] with your health care and for coming in for a physical today.  Below are some general recommendations I have for you:  Yearly screenings See your eye doctor yearly for routine vision care. See your dentist yearly for routine dental care including hygiene visits twice yearly. See me here yearly for a routine physical and preventative care visit   Cancer screening Colon cancer screening:  You will be due for colonoscopy 2021  Breast cancer screening -  You are up to date   Osteoporosis screening/Bone Density test - consider repeat 1- 2 years    Specific Concerns today:  . Shingles vaccine:  I recommend you have a shingles vaccine to help prevent shingles or herpes zoster outbreak.   Please call your insurer to inquire about coverage for the Shingrix vaccine given in 2 doses.   Some insurers cover this vaccine after age 69, some cover this after age 53.  If your insurer covers this, then call to schedule appointment to have this vaccine here. . Check insurance coverage for Td vaccine, tetanus diptheria vaccine   Please follow up yearly for a physical.    I have included other useful information below for your review.  Preventative Care for Adults - Female      MAINTAIN REGULAR HEALTH EXAMS:  A routine yearly physical is a good way to check in with your primary care provider about your health and preventive screening. It is also an opportunity to share updates about your health and any concerns you have, and receive a thorough all-over exam.   Most health insurance companies pay for at least some preventative services.  Check with your health plan for specific coverages.  WHAT PREVENTATIVE SERVICES DO WOMEN NEED?  Adult women should have their weight and blood pressure checked regularly.   Women age 60 and older should have their cholesterol levels checked regularly.  Women should be screened for cervical cancer with a Pap smear and pelvic exam  beginning at either age 33, or 3 years after they become sexually activity.    Breast cancer screening generally begins at age 15 with a mammogram and breast exam by your primary care provider.    Beginning at age 22 and continuing to age 73, women should be screened for colorectal cancer.  Certain people may need continued testing until age 63.  Updating vaccinations is part of preventative care.  Vaccinations help protect against diseases such as the flu.  Osteoporosis is a disease in which the bones lose minerals and strength as we age. Women ages 93 and over should discuss this with their caregivers, as should women after menopause who have other risk factors.  Lab tests are generally done as part of preventative care to screen for anemia and blood disorders, to screen for problems with the kidneys and liver, to screen for bladder problems, to check blood sugar, and to check your cholesterol level.  Preventative services generally include counseling about diet, exercise, avoiding tobacco, drugs, excessive alcohol consumption, and sexually transmitted infections.    GENERAL RECOMMENDATIONS FOR GOOD HEALTH:  Healthy diet:  Eat a variety of foods, including fruit, vegetables, animal or vegetable protein, such as meat, fish, chicken, and eggs, or beans, lentils, tofu, and grains, such as rice.  Drink plenty of water daily.  Decrease saturated fat in the diet, avoid lots of red meat, processed foods, sweets, fast foods, and fried foods.  Exercise:  Aerobic exercise helps maintain good heart health. At least 30-40  minutes of moderate-intensity exercise is recommended. For example, a brisk walk that increases your heart rate and breathing. This should be done on most days of the week.   Find a type of exercise or a variety of exercises that you enjoy so that it becomes a part of your daily life.  Examples are running, walking, swimming, water aerobics, and biking.  For motivation and  support, explore group exercise such as aerobic class, spin class, Zumba, Yoga,or  martial arts, etc.    Set exercise goals for yourself, such as a certain weight goal, walk or run in a race such as a 5k walk/run.  Speak to your primary care provider about exercise goals.  Disease prevention:  If you smoke or chew tobacco, find out from your caregiver how to quit. It can literally save your life, no matter how long you have been a tobacco user. If you do not use tobacco, never begin.   Maintain a healthy diet and normal weight. Increased weight leads to problems with blood pressure and diabetes.   The Body Mass Index or BMI is a way of measuring how much of your body is fat. Having a BMI above 27 increases the risk of heart disease, diabetes, hypertension, stroke and other problems related to obesity. Your caregiver can help determine your BMI and based on it develop an exercise and dietary program to help you achieve or maintain this important measurement at a healthful level.  High blood pressure causes heart and blood vessel problems.  Persistent high blood pressure should be treated with medicine if weight loss and exercise do not work.   Fat and cholesterol leaves deposits in your arteries that can block them. This causes heart disease and vessel disease elsewhere in your body.  If your cholesterol is found to be high, or if you have heart disease or certain other medical conditions, then you may need to have your cholesterol monitored frequently and be treated with medication.   Ask if you should have a cardiac stress test if your history suggests this. A stress test is a test done on a treadmill that looks for heart disease. This test can find disease prior to there being a problem.  Menopause can be associated with physical symptoms and risks. Hormone replacement therapy is available to decrease these. You should talk to your caregiver about whether starting or continuing to take hormones  is right for you.   Osteoporosis is a disease in which the bones lose minerals and strength as we age. This can result in serious bone fractures. Risk of osteoporosis can be identified using a bone density scan. Women ages 72 and over should discuss this with their caregivers, as should women after menopause who have other risk factors. Ask your caregiver whether you should be taking a calcium supplement and Vitamin D, to reduce the rate of osteoporosis.   Avoid drinking alcohol in excess (more than two drinks per day).  Avoid use of street drugs. Do not share needles with anyone. Ask for professional help if you need assistance or instructions on stopping the use of alcohol, cigarettes, and/or drugs.  Brush your teeth twice a day with fluoride toothpaste, and floss once a day. Good oral hygiene prevents tooth decay and gum disease. The problems can be painful, unattractive, and can cause other health problems. Visit your dentist for a routine oral and dental check up and preventive care every 6-12 months.   Look at your skin regularly.  Use a  mirror to look at your back. Notify your caregivers of changes in moles, especially if there are changes in shapes, colors, a size larger than a pencil eraser, an irregular border, or development of new moles.  Safety:  Use seatbelts 100% of the time, whether driving or as a passenger.  Use safety devices such as hearing protection if you work in environments with loud noise or significant background noise.  Use safety glasses when doing any work that could send debris in to the eyes.  Use a helmet if you ride a bike or motorcycle.  Use appropriate safety gear for contact sports.  Talk to your caregiver about gun safety.  Use sunscreen with a SPF (or skin protection factor) of 15 or greater.  Lighter skinned people are at a greater risk of skin cancer. Don't forget to also wear sunglasses in order to protect your eyes from too much damaging sunlight. Damaging  sunlight can accelerate cataract formation.   Practice safe sex. Use condoms. Condoms are used for birth control and to help reduce the spread of sexually transmitted infections (or STIs).  Some of the STIs are gonorrhea (the clap), chlamydia, syphilis, trichomonas, herpes, HPV (human papilloma virus) and HIV (human immunodeficiency virus) which causes AIDS. The herpes, HIV and HPV are viral illnesses that have no cure. These can result in disability, cancer and death.   Keep carbon monoxide and smoke detectors in your home functioning at all times. Change the batteries every 6 months or use a model that plugs into the wall.   Vaccinations:  Stay up to date with your tetanus shots and other required immunizations. You should have a booster for tetanus every 10 years. Be sure to get your flu shot every year, since 5%-20% of the [place] population comes down with the flu. The flu vaccine changes each year, so being vaccinated once is not enough. Get your shot in the fall, before the flu season peaks.   Other vaccines to consider:  Human Papilloma Virus or HPV causes cancer of the cervix, and other infections that can be transmitted from person to person. There is a vaccine for HPV, and females should get immunized between the ages of 29 and 80. It requires a series of 3 shots.   Pneumococcal vaccine to protect against certain types of pneumonia.  This is normally recommended for adults age 59 or older.  However, adults younger than 65 years old with certain underlying conditions such as diabetes, heart or lung disease should also receive the vaccine.  Shingles vaccine to protect against Varicella Zoster if you are older than age 33, or younger than 65 years old with certain underlying illness.  If you have not had the Shingrix vaccine, please call your insurer to inquire about coverage for the Shingrix vaccine given in 2 doses.   Some insurers cover this vaccine after age 37, some cover this after age 74.   If your insurer covers this, then call to schedule appointment to have this vaccine here  Hepatitis A vaccine to protect against a form of infection of the liver by a virus acquired from food.  Hepatitis B vaccine to protect against a form of infection of the liver by a virus acquired from blood or body fluids, particularly if you work in health care.  If you plan to travel internationally, check with your local health department for specific vaccination recommendations.  Cancer Screening:  Breast cancer screening is essential to preventive care for women. All women age  20 and older should perform a breast self-exam every month. At age 46 and older, women should have their caregiver complete a breast exam each year. Women at ages 51 and older should have a mammogram (x-ray film) of the breasts. Your caregiver can discuss how often you need mammograms.    Cervical cancer screening includes taking a Pap smear (sample of cells examined under a microscope) from the cervix (end of the uterus). It also includes testing for HPV (Human Papilloma Virus, which can cause cervical cancer). Screening and a pelvic exam should begin at age 39, or 3 years after a woman becomes sexually active. Screening should occur every year, with a Pap smear but no HPV testing, up to age 61. After age 29, you should have a Pap smear every 3 years with HPV testing, if no HPV was found previously.   Most routine colon cancer screening begins at the age of 3. On a yearly basis, doctors may provide special easy to use take-home tests to check for hidden blood in the stool. Sigmoidoscopy or colonoscopy can detect the earliest forms of colon cancer and is life saving. These tests use a small camera at the end of a tube to directly examine the colon. Speak to your caregiver about this at age 56, when routine screening begins (and is repeated every 5 years unless early forms of pre-cancerous polyps or small growths are found).

## 2019-01-10 ENCOUNTER — Other Ambulatory Visit: Payer: Self-pay | Admitting: Medical

## 2019-01-10 DIAGNOSIS — E785 Hyperlipidemia, unspecified: Secondary | ICD-10-CM

## 2019-01-10 DIAGNOSIS — I10 Essential (primary) hypertension: Secondary | ICD-10-CM

## 2019-01-10 LAB — CBC WITH DIFFERENTIAL/PLATELET
Basophils Absolute: 0 10*3/uL (ref 0.0–0.2)
Basos: 0 %
EOS (ABSOLUTE): 0.1 10*3/uL (ref 0.0–0.4)
Eos: 1 %
Hematocrit: 37.9 % (ref 34.0–46.6)
Hemoglobin: 13.1 g/dL (ref 11.1–15.9)
Immature Grans (Abs): 0 10*3/uL (ref 0.0–0.1)
Immature Granulocytes: 0 %
Lymphocytes Absolute: 1.8 10*3/uL (ref 0.7–3.1)
Lymphs: 31 %
MCH: 30.2 pg (ref 26.6–33.0)
MCHC: 34.6 g/dL (ref 31.5–35.7)
MCV: 87 fL (ref 79–97)
Monocytes Absolute: 0.5 10*3/uL (ref 0.1–0.9)
Monocytes: 8 %
Neutrophils Absolute: 3.5 10*3/uL (ref 1.4–7.0)
Neutrophils: 60 %
Platelets: 296 10*3/uL (ref 150–450)
RBC: 4.34 x10E6/uL (ref 3.77–5.28)
RDW: 12.9 % (ref 11.7–15.4)
WBC: 5.9 10*3/uL (ref 3.4–10.8)

## 2019-01-10 LAB — LIPID PANEL
Chol/HDL Ratio: 3.5 ratio (ref 0.0–4.4)
Cholesterol, Total: 233 mg/dL — ABNORMAL HIGH (ref 100–199)
HDL: 66 mg/dL (ref >39)
LDL Chol Calc (NIH): 156 mg/dL — ABNORMAL HIGH (ref 0–99)
Triglycerides: 63 mg/dL (ref 0–149)
VLDL Cholesterol Cal: 11 mg/dL (ref 5–40)

## 2019-01-10 LAB — COMPREHENSIVE METABOLIC PANEL
ALT: 17 IU/L (ref 0–32)
AST: 24 IU/L (ref 0–40)
Albumin/Globulin Ratio: 1.9 (ref 1.2–2.2)
Albumin: 4.7 g/dL (ref 3.8–4.8)
Alkaline Phosphatase: 85 IU/L (ref 39–117)
BUN/Creatinine Ratio: 18 (ref 12–28)
BUN: 14 mg/dL (ref 8–27)
Bilirubin Total: 0.3 mg/dL (ref 0.0–1.2)
CO2: 23 mmol/L (ref 20–29)
Calcium: 9.7 mg/dL (ref 8.7–10.3)
Chloride: 99 mmol/L (ref 96–106)
Creatinine, Ser: 0.76 mg/dL (ref 0.57–1.00)
GFR calc Af Amer: 95 mL/min/{1.73_m2} (ref >59)
GFR calc non Af Amer: 83 mL/min/{1.73_m2} (ref >59)
Globulin, Total: 2.5 g/dL (ref 1.5–4.5)
Glucose: 84 mg/dL (ref 65–99)
Potassium: 5.4 mmol/L — ABNORMAL HIGH (ref 3.5–5.2)
Sodium: 139 mmol/L (ref 134–144)
Total Protein: 7.2 g/dL (ref 6.0–8.5)

## 2019-01-10 LAB — VITAMIN D 25 HYDROXY (VIT D DEFICIENCY, FRACTURES): Vit D, 25-Hydroxy: 47.7 ng/mL (ref 30.0–100.0)

## 2019-01-10 MED ORDER — LISINOPRIL 5 MG PO TABS: 2.5000 mg | ORAL_TABLET | Freq: Every day | ORAL | 1 refills | Status: DC

## 2019-01-22 ENCOUNTER — Telehealth: Payer: Self-pay

## 2019-01-22 ENCOUNTER — Other Ambulatory Visit: Payer: Self-pay | Admitting: Medical

## 2019-01-22 MED ORDER — ROSUVASTATIN CALCIUM 5 MG PO TABS: 5.0000 mg | ORAL_TABLET | Freq: Every day | ORAL | 3 refills | Status: DC

## 2019-01-22 NOTE — Telephone Encounter (Signed)
Patient lmom stating she would like to try the low dose crestor. Please advise.

## 2019-01-22 NOTE — Telephone Encounter (Signed)
Med sent  Recommend hepatic function lab in 30mo

## 2019-02-21 DIAGNOSIS — C44722 Squamous cell carcinoma of skin of right lower limb, including hip: Secondary | ICD-10-CM

## 2019-02-21 HISTORY — DX: Squamous cell carcinoma of skin of right lower limb, including hip: C44.722

## 2019-03-19 ENCOUNTER — Ambulatory Visit: Payer: BC Managed Care – PPO

## 2019-03-28 ENCOUNTER — Ambulatory Visit: Payer: Medicare PPO | Attending: Internal Medicine

## 2019-03-28 DIAGNOSIS — Z23 Encounter for immunization: Secondary | ICD-10-CM

## 2019-03-28 NOTE — Progress Notes (Signed)
   Covid-19 Vaccination Clinic  Name:  JESSICIA WESCHLER    MRN: [id number] DOB: 1955/05/20  03/28/2019  Ms. Convery was observed post Covid-19 immunization for 15 minutes without incidence. She was provided with Vaccine Information Sheet and instruction to access the V-Safe system.   Ms. Longhenry was instructed to call 911 with any severe reactions post vaccine: Marland Kitchen Difficulty breathing  . Swelling of your face and throat  . A fast heartbeat  . A bad rash all over your body  . Dizziness and weakness    Immunizations Administered    Name Date Dose VIS Date Route   Pfizer COVID-19 Vaccine 03/28/2019 10:07 AM 0.3 mL 01/31/2019 Intramuscular   Manufacturer: [place]   Lot: [id number]   [place]: [id number]

## 2019-04-13 ENCOUNTER — Ambulatory Visit: Payer: BC Managed Care – PPO

## 2019-04-22 ENCOUNTER — Ambulatory Visit: Payer: Medicare PPO | Attending: Internal Medicine

## 2019-04-22 DIAGNOSIS — Z23 Encounter for immunization: Secondary | ICD-10-CM

## 2019-04-22 NOTE — Progress Notes (Signed)
   Covid-19 Vaccination Clinic  Name:  Sherri Rangel    MRN: [id number] DOB: Jun 27, 1955  04/22/2019  Ms. [name] was observed post Covid-19 immunization for 15 minutes without incident. She was provided with Vaccine Information Sheet and instruction to access the V-Safe system.   Ms. [name] was instructed to call 911 with any severe reactions post vaccine: Marland Kitchen Difficulty breathing  . Swelling of face and throat  . A fast heartbeat  . A bad rash all over body  . Dizziness and weakness   Immunizations Administered    Name Date Dose VIS Date Route   Pfizer COVID-19 Vaccine 04/22/2019 10:52 AM 0.3 mL 01/31/2019 Intramuscular   Manufacturer: [place]   Lot: [id number]   [place]: [id number]

## 2019-04-29 ENCOUNTER — Other Ambulatory Visit: Payer: Self-pay | Admitting: Medical

## 2019-04-29 ENCOUNTER — Other Ambulatory Visit: Payer: Self-pay

## 2019-04-29 ENCOUNTER — Telehealth: Payer: Self-pay | Admitting: Internal Medicine

## 2019-04-29 ENCOUNTER — Other Ambulatory Visit: Payer: Medicare PPO

## 2019-04-29 DIAGNOSIS — Z79899 Other long term (current) drug therapy: Secondary | ICD-10-CM

## 2019-04-29 NOTE — Telephone Encounter (Signed)
Pt is here for labs and no orders.

## 2019-04-30 LAB — HEPATIC FUNCTION PANEL
ALT: 20 IU/L (ref 0–32)
AST: 23 IU/L (ref 0–40)
Albumin: 4.5 g/dL (ref 3.8–4.8)
Alkaline Phosphatase: 85 IU/L (ref 39–117)
Bilirubin Total: 0.4 mg/dL (ref 0.0–1.2)
Bilirubin, Direct: 0.12 mg/dL (ref 0.00–0.40)
Total Protein: 6.5 g/dL (ref 6.0–8.5)

## 2019-08-27 ENCOUNTER — Encounter: Payer: Self-pay | Admitting: Medical

## 2019-08-27 ENCOUNTER — Other Ambulatory Visit: Payer: Self-pay

## 2019-08-27 ENCOUNTER — Ambulatory Visit: Payer: Medicare PPO | Admitting: Medical

## 2019-08-27 VITALS — BP 134/78 | HR 58 | Temp 98.8°F | Ht 67.0 in | Wt 151.0 lb

## 2019-08-27 DIAGNOSIS — M25561 Pain in right knee: Secondary | ICD-10-CM | POA: Diagnosis not present

## 2019-08-27 DIAGNOSIS — M25562 Pain in left knee: Secondary | ICD-10-CM | POA: Diagnosis not present

## 2019-08-27 DIAGNOSIS — M778 Other enthesopathies, not elsewhere classified: Secondary | ICD-10-CM

## 2019-08-27 DIAGNOSIS — M79671 Pain in right foot: Secondary | ICD-10-CM | POA: Diagnosis not present

## 2019-08-27 DIAGNOSIS — G8929 Other chronic pain: Secondary | ICD-10-CM

## 2019-08-27 NOTE — Progress Notes (Signed)
Subjective:  Sherri Rangel is a 64 y.o. female who presents for Chief Complaint  Patient presents with  . Foot Pain    right   . Knee Pain    bilateral chronic pain     Here for ongoing pain in right foot, has hx/o knee pains.   Left foot was hurting but it resolved.  She is active, has aches and pains periodically but usually things resolve.  Lately though the right foot is not getting better.  Has pain in top lateral right foot.   At times screaming sharp pain if up on tip toes.  If she tried to do lunges would get a lot of pain.  Was doing step aerobics but that hurts now.  Feels like she has to keep right foot flat.  No specific injury or trauma.   Has tried some ice therapy.     Has ongoing chronic knee pain, no worse than usual.  Recent activity includes walking, step aerobics, stretching routine "fake yoga".   Does cardio dance sometimes.  No other aggravating or relieving factors.    No other c/o.  Past Medical History:  Diagnosis Date  . Anemia    prior to 2016  . BCC (basal cell carcinoma)    right eye, LLE  . Colon polyp 08/2009   WNL, internal hemorrhoid (Dr. Earlean Rangel); also had colonoscopy 2008 with Dr. Carlean Rangel  . Encounter for routine gynecological examination    sees Dr. Uvaldo Rangel  . H/O bone density study    normal 2017, 2015  . Hyperlipidemia   . Hypertension   . Iron deficiency 06/2009   negative EGD and colonoscopy 08/2009  . Menopausal state   . Vaginal atrophy    Current Outpatient Medications on File Prior to Visit  Medication Sig Dispense Refill  . Cholecalciferol (VITAMIN D) 50 MCG (2000 UT) CAPS     . Coenzyme Q10 (CO Q 10 PO) Take by mouth.    Marland Kitchen lisinopril (ZESTRIL) 2.5 MG tablet     . Multiple Vitamin (MULTIVITAMIN) tablet Take 1 tablet by mouth daily.      . Omega-3 Fatty Acids (FISH OIL PO) Take 1 tablet by mouth daily.    Marland Kitchen RA KRILL OIL PO Take 750 mg by mouth 2 (two) times daily.    . rosuvastatin (CRESTOR) 5 MG tablet Take 1 tablet (5  mg total) by mouth at bedtime. 90 tablet 3  . Turmeric 500 MG CAPS Take by mouth.    . cycloSPORINE (RESTASIS) 0.05 % ophthalmic emulsion 1 drop 2 (two) times daily. (Patient not taking: Reported on 08/27/2019)     No current facility-administered medications on file prior to visit.   The following portions of the patient's history were reviewed and updated as appropriate: allergies, current medications, past family history, past medical history, past social history, past surgical history and problem list.  ROS Otherwise as in subjective above  Objective: BP 134/78   Pulse (!) 58   Temp 98.8 F (37.1 C)   Ht 5\' 7"  (1.702 m)   Wt 151 lb (68.5 kg)   SpO2 97%   BMI 23.65 kg/m    General appearance: alert, no distress, well developed, well nourished Bilateral knees without obvious deformity, no laxity, normal range of motion, nontender, normal exam, hips nontender normal range of motion without pain Mildly tender over right fourth and fifth metatarsals and ATFL, mild pain with dorsiflexion in the same area of the right foot laterally, mild pain with  resisted dorsiflexion again in the right fourth and fifth metatarsal and ATFL region, more pain when she actually stands on her tiptoes in the same area otherwise foot nontender, relatively good arches, no swelling, normal range of motion in general Legs and feet neurovascularly intact    Assessment: Encounter Diagnoses  Name Primary?  . Chronic pain of left knee Yes  . Chronic pain of right knee   . Extensor tendinitis of foot   . Foot pain, right      Plan: Chronic knee pain-I suspect either possibly some arthritis or patellofemoral syndrome.  Exam is normal.  She will go for x-rays to get a better idea of arthritis or joint space.  Assuming minimal arthritis the recommendation will be avoid wearing tear with hard impact on asphalt and concrete, ice and anti-inflammatory the counter as needed for flareups, otherwise continue regular  exercise and stretching  Foot pain, tendinitis-  Likely tendonitis/strain injury  For the next week, use alternative cold and heat therapy such as alternating bucket of cold water 20 minutes, then after foot is room temp, change to heat therapy for 20 minutes.  Do this off and on for the next week  You can use Advil over the counter 200mg , 3 tablets twice daily for the next week  Consider ankle brace, Velcro type over the counter, such as Aircast or ASO brands.   Use this for the next 1-2 weeks daily  Avoid step aerobics, jumping, dance temporarily  Instead use stationary bike, swimming, walking etc, less impact and less up and down on the feet  We can consider xray   Sherri Rangel was seen today for foot pain and knee pain.  Diagnoses and all orders for this visit:  Chronic pain of left knee -     DG Knee Complete 4 Views Left; Future  Chronic pain of right knee -     DG Knee Complete 4 Views Right; Future  Extensor tendinitis of foot  Foot pain, right    Follow up: 2 weeks and pending x-ray

## 2019-08-27 NOTE — Patient Instructions (Addendum)
Knee pain   Arthritis vs patellofemoral issues vs wear and tear  Consider xrays  Avoid hard impact activity such as running on asphalt or concrete, avoid lots of jumping up and down on hard surface  Ice periodically as needed for pain  Advil as needed short term   Please go to [facility] for your knee xrays.   Their hours are 8am - 4:30 pm Monday - Friday.  Take your insurance card with you.  [facility] [phone]  [address], [place], [place] [address]  [address], [place] [address]       Foot pain  Likely tendonitis/strain injury  For the next week, use alternative cold and heat therapy such as alternating bucket of cold water 20 minutes, then after foot is room temp, change to heat therapy for 20 minutes.  Do this off and on for the next week  You can use Advil over the counter 200mg , 3 tablets twice daily for the next week  Consider ankle brace, Velcro type over the counter, such as Aircast or ASO brands.   Use this for the next 1-2 weeks daily  Avoid step aerobics, jumping, dance temporarily  Instead use stationary bike, swimming, walking etc, less impact and less up and down on the feet  We can consider xray     Patellofemoral Pain Syndrome  Patellofemoral pain syndrome is a condition in which the tissue (cartilage) on the underside of the kneecap (patella) softens or breaks down. This causes pain in the front of the knee. The condition is also called runner's knee or chondromalacia patella. Patellofemoral pain syndrome is most common in young adults who are active in sports. The knee is the largest joint in the body. The patella covers the front of the knee and is attached to muscles above and below the knee. The underside of the patella is covered with a smooth type of cartilage (synovium). The smooth surface helps the patella to glide easily when you move your knee. Patellofemoral pain syndrome causes swelling in the  joint linings and bone surfaces in the knee. What are the causes? This condition may be caused by:  Overuse of the knee.  Poor alignment of your knee joints.  Weak leg muscles.  A direct blow to your kneecap. What increases the risk? You are more likely to develop this condition if:  You do a lot of activities that can wear down your kneecap. These include: ? Running. ? Squatting. ? Climbing stairs.  You start a new physical activity or exercise program.  You wear shoes that do not fit well.  You do not have good leg strength.  You are overweight. What are the signs or symptoms? The main symptom of this condition is knee pain. This may feel like a dull, aching pain underneath your patella, in the front of your knee. There may be a popping or cracking sound when you move your knee. Pain may get worse with:  Exercise.  Climbing stairs.  Running.  Jumping.  Squatting.  Kneeling.  Sitting for a long time.  Moving or pushing on your patella. How is this diagnosed? This condition may be diagnosed based on:  Your symptoms and medical history. You may be asked about your recent physical activities and which ones cause knee pain.  A physical exam. This may include: ? Moving your patella back and forth. ? Checking your range of knee motion. ? Having you squat or jump to see if  you have pain. ? Checking the strength of your leg muscles.  Imaging tests to confirm the diagnosis. These may include an MRI of your knee. How is this treated? This condition may be treated at home with rest, ice, compression, and elevation (RICE).  Other treatments may include:  Nonsteroidal anti-inflammatory drugs (NSAIDs).  Physical therapy to stretch and strengthen your leg muscles.  Shoe inserts (orthotics) to take stress off your knee.  A knee brace or knee support.  Adhesive tapes to the skin.  Surgery to remove damaged cartilage or move the patella to a better position. This  is rare. Follow these instructions at home: If you have a shoe or brace:  Wear the shoe or brace as told by your health care provider. Remove it only as told by your health care provider.  Loosen the shoe or brace if your toes tingle, become numb, or turn cold and blue.  Keep the shoe or brace clean.  If the shoe or brace is not waterproof: ? Do not let it get wet. ? Cover it with a watertight covering when you take a bath or a shower. Managing pain, stiffness, and swelling  If directed, put ice on the painful area. ? If you have a removable shoe or brace, remove it as told by your health care provider. ? Put ice in a plastic bag. ? Place a towel between your skin and the bag. ? Leave the ice on for 20 minutes, 2-3 times a day.  Move your toes often to avoid stiffness and to lessen swelling.  Rest your knee: ? Avoid activities that cause knee pain. ? When sitting or lying down, raise (elevate) the injured area above the level of your heart, whenever possible. General instructions  Take over-the-counter and prescription medicines only as told by your health care provider.  Use splints, braces, knee supports, or walking aids as directed by your health care provider.  Perform stretching and strengthening exercises as told by your health care provider or physical therapist.  Do not use any products that contain nicotine or tobacco, such as cigarettes and e-cigarettes. These can delay healing. If you need help quitting, ask your health care provider.  Return to your normal activities as told by your health care provider. Ask your health care provider what activities are safe for you.  Keep all follow-up visits as told by your health care provider. This is important. Contact a health care provider if:  Your symptoms get worse.  You are not improving with home care. Summary  Patellofemoral pain syndrome is a condition in which the tissue (cartilage) on the underside of the  kneecap (patella) softens or breaks down.  This condition causes swelling in the joint linings and bone surfaces in the knee. This leads to pain in the front of the knee.  This condition may be treated at home with rest, ice, compression, and elevation (RICE).  Use splints, braces, knee supports, or walking aids as directed by your health care provider. This information is not intended to replace advice given to you by your health care provider. Make sure you discuss any questions you have with your health care provider. Document Revised: 03/19/2017 Document Reviewed: 03/19/2017 Elsevier Patient Education  2020 [employer].

## 2019-08-29 ENCOUNTER — Ambulatory Visit
Admission: RE | Admit: 2019-08-29 | Discharge: 2019-08-29 | Disposition: A | Discharge status: Home / Self Care | Payer: Medicare PPO | Source: Ambulatory Visit | Attending: Medical | Admitting: Medical

## 2019-08-29 DIAGNOSIS — M25562 Pain in left knee: Secondary | ICD-10-CM

## 2019-08-29 DIAGNOSIS — G8929 Other chronic pain: Secondary | ICD-10-CM

## 2019-10-17 ENCOUNTER — Other Ambulatory Visit: Payer: Self-pay | Admitting: Medical

## 2019-11-13 ENCOUNTER — Encounter: Payer: Self-pay | Admitting: Obstetrics & Gynecology

## 2019-11-13 LAB — HM MAMMOGRAPHY

## 2019-11-24 ENCOUNTER — Telehealth: Payer: Self-pay | Admitting: Medical

## 2019-11-24 NOTE — Telephone Encounter (Signed)
Patient has been informed.

## 2019-11-24 NOTE — Telephone Encounter (Signed)
I am happy to report that her mammogram was normal, no worrisome findings.

## 2019-12-09 ENCOUNTER — Ambulatory Visit: Payer: Medicare PPO | Admitting: Obstetrics & Gynecology

## 2019-12-09 ENCOUNTER — Other Ambulatory Visit: Payer: Self-pay

## 2019-12-09 ENCOUNTER — Encounter: Payer: Self-pay | Admitting: Obstetrics & Gynecology

## 2019-12-09 VITALS — BP 120/78 | Ht 67.0 in | Wt 147.0 lb

## 2019-12-09 DIAGNOSIS — Z01419 Encounter for gynecological examination (general) (routine) without abnormal findings: Secondary | ICD-10-CM

## 2019-12-09 DIAGNOSIS — N83201 Unspecified ovarian cyst, right side: Secondary | ICD-10-CM

## 2019-12-09 DIAGNOSIS — Z780 Asymptomatic menopausal state: Secondary | ICD-10-CM

## 2019-12-09 DIAGNOSIS — Z8041 Family history of malignant neoplasm of ovary: Secondary | ICD-10-CM

## 2019-12-09 NOTE — Progress Notes (Signed)
Sherri Rangel 28-Apr-1955 [id number]   History:    64 y.o. 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  IR:[id number] presenting for annual gyn exam   OIZ:[id number], well on no hormone replacement therapy. No postmenopausal bleeding. No pelvic pain. No pain with intercourse.  Pelvic US 2018 Rt ovarian cyst 1 cm. Urine and bowel movements normal. Breasts normal currently. Screening mammogram 10/2019.  Body mass index 23.02. Regular fitness activities. Health labs with family physician.  BD normal in 2018.  [place] 2016.  Past medical history,surgical history, family history and social history were all reviewed and documented in the EPIC chart.  Gynecologic History No LMP recorded. Patient is postmenopausal.  Obstetric History OB History  Gravida Para Term Preterm AB Living  3 3       3   SAB TAB Ectopic Multiple Live Births               # Outcome Date GA Lbr Len/2nd Weight Sex Delivery Anes PTL Lv  3 Para           2 Para           1 Para              ROS: A ROS was performed and pertinent positives and negatives are included in the history.  GENERAL: No fevers or chills. HEENT: No change in vision, no earache, sore throat or sinus congestion. NECK: No pain or stiffness. CARDIOVASCULAR: No chest pain or pressure. No palpitations. PULMONARY: No shortness of breath, cough or wheeze. GASTROINTESTINAL: No abdominal pain, nausea, vomiting or diarrhea, melena or bright red blood per rectum. GENITOURINARY: No urinary frequency, urgency, hesitancy or dysuria. MUSCULOSKELETAL: No joint or muscle pain, no back pain, no recent trauma. DERMATOLOGIC: No rash, no itching, no lesions. ENDOCRINE: No polyuria, polydipsia, no heat or cold intolerance. No recent change in weight. HEMATOLOGICAL: No anemia or easy bruising or bleeding. NEUROLOGIC: No headache, seizures, numbness, tingling or weakness. PSYCHIATRIC: No depression, no loss of interest in normal activity  or change in sleep pattern.     Exam:   BP 120/78   Ht 5\' 7"  (1.702 m)   Wt 147 lb (66.7 kg)   BMI 23.02 kg/m   Body mass index is 23.02 kg/m.  General appearance : Well developed well nourished female. No acute distress HEENT: Eyes: no retinal hemorrhage or exudates,  Neck supple, trachea midline, no carotid bruits, no thyroidmegaly Lungs: Clear to auscultation, no rhonchi or wheezes, or rib retractions  Heart: Regular rate and rhythm, no murmurs or gallops Breast:Examined in sitting and supine position were symmetrical in appearance, no palpable masses or tenderness,  no skin retraction, no nipple inversion, no nipple discharge, no skin discoloration, no axillary or supraclavicular lymphadenopathy Abdomen: no palpable masses or tenderness, no rebound or guarding Extremities: no edema or skin discoloration or tenderness  Pelvic: Vulva: Normal             Vagina: No gross lesions or discharge  Cervix: No gross lesions or discharge.  Pap reflex done.  Uterus AV, normal size, shape and consistency, non-tender and mobile  Adnexa  Without masses or tenderness  Anus: Normal   Assessment/Plan:  64 y.o. female for annual exam   1. Encounter for routine gynecological examination with Papanicolaou smear of cervix Normal gynecologic exam in menopause.  Pap reflex done.  Breast exam normal.  Screening mammogram September 2021 was negative.  Colonoscopy 2016.  Good body mass index at 23.02.  Continue  with fitness and healthy nutrition.  Health labs with family PA.  2. Postmenopausal Well on no hormone replacement therapy.  No postmenopausal bleeding.  Bone density normal in 2018.  Continue with vitamin D supplements, calcium intake of 1500 mg daily and regular weightbearing physical activities.  We will repeat a bone density in 2023.  3. Cyst of right ovary Had a 1 cm right ovarian cyst on pelvic ultrasound in 2018.  We will repeat a pelvic ultrasound now to follow-up on the right ovarian  cyst and the context of a positive family history of ovarian cancer. - US Transvaginal Non-OB; Future  4. Family history of ovarian cancer - US Transvaginal Non-OB; Future  Princess Bruins MD, 10:29 AM 12/09/2019

## 2019-12-11 ENCOUNTER — Encounter: Payer: Self-pay | Admitting: Obstetrics & Gynecology

## 2019-12-11 LAB — PAP IG W/ RFLX HPV ASCU

## 2019-12-18 ENCOUNTER — Ambulatory Visit (INDEPENDENT_AMBULATORY_CARE_PROVIDER_SITE_OTHER): Payer: Medicare PPO

## 2019-12-18 ENCOUNTER — Other Ambulatory Visit: Payer: Self-pay

## 2019-12-18 ENCOUNTER — Ambulatory Visit: Payer: Medicare PPO | Admitting: Obstetrics & Gynecology

## 2019-12-18 DIAGNOSIS — Z780 Asymptomatic menopausal state: Secondary | ICD-10-CM | POA: Diagnosis not present

## 2019-12-18 DIAGNOSIS — N83201 Unspecified ovarian cyst, right side: Secondary | ICD-10-CM

## 2019-12-18 DIAGNOSIS — Z8041 Family history of malignant neoplasm of ovary: Secondary | ICD-10-CM | POA: Diagnosis not present

## 2019-12-18 NOTE — Progress Notes (Signed)
    Sherri Rangel 04-13-1955 [id number]        64 y.o.  G3P3L3  RP: Family h/o Ovarian Ca for Pelvic US  HPI: Postmenopausal on no HRT.  No PMB.  No pelvic pain.   OB History  Gravida Para Term Preterm AB Living  3 3       3   SAB TAB Ectopic Multiple Live Births               # Outcome Date GA Lbr Len/2nd Weight Sex Delivery Anes PTL Lv  3 Para           2 Para           1 Para             Past medical history,surgical history, problem list, medications, allergies, family history and social history were all reviewed and documented in the EPIC chart.   Directed ROS with pertinent positives and negatives documented in the history of present illness/assessment and plan.  Exam:  There were no vitals filed for this visit. General appearance:  Normal  Pelvic US today: T/V images.  The uterus is anteverted and atrophic measured at 5.22 x 3.81 x 2.79 cm.  The endometrial lining is thin, symmetrical, with no mass or thickening seen.  It is measured at 0.93 mm.  Both ovaries are atrophic with peripheral calcifications less than 2 mm.  The right ovary presents a collapsed remnant avascular follicle measured at 4.7 mm.  It is decreased in size since September 2018 when it was measured at 10 mm.  No adnexal mass.  No free fluid in the posterior cul-de-sac.   Assessment/Plan:  64 y.o. G3P3   1. Family history of ovarian cancer Family history positive for ovarian cancer and presence of a small right ovarian cyst measured at 1 cm on pelvic ultrasound in September 2018.  No pelvic pain.  Pelvic ultrasound findings thoroughly reviewed with patient today.  Both ovaries are atrophic with nonsignificant very small peripheral calcifications.  The right ovary presents a collapsed avascular follicle remnant measured at 4.7 mm, decreased in size from 10 mm in 2018.  No adnexal mass and no free fluid in the posterior cul-de-sac.  The uterus is normal.  Patient reassured.  2. Cyst of right ovary As  above.  3. Postmenopausal Well on no hormone replacement therapy, no postmenopausal bleeding.  Princess Bruins MD, 12:13 PM 12/18/2019

## 2019-12-19 ENCOUNTER — Encounter: Payer: Self-pay | Admitting: Obstetrics & Gynecology

## 2020-01-19 ENCOUNTER — Other Ambulatory Visit: Payer: Self-pay | Admitting: Medical

## 2020-01-28 ENCOUNTER — Telehealth: Payer: Self-pay | Admitting: Medical

## 2020-01-28 ENCOUNTER — Encounter: Payer: Self-pay | Admitting: Medical

## 2020-02-06 NOTE — Telephone Encounter (Signed)
error 

## 2020-02-24 LAB — HM COLONOSCOPY

## 2020-03-16 ENCOUNTER — Encounter: Payer: Self-pay | Admitting: Internal Medicine

## 2020-04-19 ENCOUNTER — Encounter: Payer: Self-pay | Admitting: Medical

## 2020-04-19 ENCOUNTER — Ambulatory Visit
Admission: RE | Admit: 2020-04-19 | Discharge: 2020-04-19 | Disposition: A | Discharge status: Home / Self Care | Payer: Medicare PPO | Source: Ambulatory Visit | Attending: Medical | Admitting: Medical

## 2020-04-19 ENCOUNTER — Ambulatory Visit: Payer: Medicare PPO | Admitting: Medical

## 2020-04-19 ENCOUNTER — Other Ambulatory Visit: Payer: Self-pay

## 2020-04-19 VITALS — BP 118/72 | HR 65 | Temp 97.8°F | Wt 147.4 lb

## 2020-04-19 DIAGNOSIS — M545 Low back pain, unspecified: Secondary | ICD-10-CM | POA: Diagnosis not present

## 2020-04-19 DIAGNOSIS — G8929 Other chronic pain: Secondary | ICD-10-CM | POA: Insufficient documentation

## 2020-04-19 DIAGNOSIS — Z780 Asymptomatic menopausal state: Secondary | ICD-10-CM

## 2020-04-19 DIAGNOSIS — Z23 Encounter for immunization: Secondary | ICD-10-CM | POA: Diagnosis not present

## 2020-04-19 DIAGNOSIS — I10 Essential (primary) hypertension: Secondary | ICD-10-CM

## 2020-04-19 DIAGNOSIS — Z129 Encounter for screening for malignant neoplasm, site unspecified: Secondary | ICD-10-CM

## 2020-04-19 DIAGNOSIS — Z13820 Encounter for screening for osteoporosis: Secondary | ICD-10-CM | POA: Insufficient documentation

## 2020-04-19 DIAGNOSIS — Z7185 Encounter for immunization safety counseling: Secondary | ICD-10-CM

## 2020-04-19 DIAGNOSIS — E785 Hyperlipidemia, unspecified: Secondary | ICD-10-CM

## 2020-04-19 LAB — POCT URINALYSIS DIP (PROADVANTAGE DEVICE)
Bilirubin, UA: NEGATIVE
Blood, UA: NEGATIVE
Glucose, UA: NEGATIVE mg/dL
Ketones, POC UA: NEGATIVE mg/dL
Leukocytes, UA: NEGATIVE
Nitrite, UA: NEGATIVE
Protein Ur, POC: NEGATIVE mg/dL
Specific Gravity, Urine: 1.01
Urobilinogen, Ur: 0.2
pH, UA: 6 (ref 5.0–8.0)

## 2020-04-19 NOTE — Progress Notes (Signed)
Subjective:  Sherri Rangel is a 65 y.o. female who presents for Chief Complaint  Patient presents with  . other    Med check lower back issues sciatic nerve pain possibly not sure started last fall.      Here for med check.  Lately been having some back pains in the low back.  Occasionally this radiates down her leg.  No distinct numbness tingling or weakness.  The pain can sometimes be in the right lower back above the hip.  She denies injury trauma or fall.  There fever, no weight loss, no appetite change.  She was concerned because her daughter who was in her 19s just recently had to have back surgery for discectomy  Hypertension-compliant with medication without complaint  Hyperlipidemia-compliant with medication without complaint  She thinks her last bone density scan was probably the one on file from 2017  She has had her COVID vaccines and booster.  She has her card with her today  She is exercising with both aerobic and weightbearing exercise.  She did 4 miles this morning.  No other c/o.  Past Medical History:  Diagnosis Date  . Anemia    prior to 2016  . BCC (basal cell carcinoma)    right eye, LLE  . Colon polyp 08/2009   WNL, internal hemorrhoid (Dr. Earlean Shawl); also had colonoscopy 2008 with Dr. Carlean Purl  . Encounter for routine gynecological examination    sees Dr. Uvaldo Rising  . H/O bone density study    normal 2017, 2015  . Hyperlipidemia   . Hypertension   . Iron deficiency 06/2009   negative EGD and colonoscopy 08/2009  . Menopausal state   . Squamous cell carcinoma, leg, right 2021  . Vaginal atrophy    Current Outpatient Medications on File Prior to Visit  Medication Sig Dispense Refill  . Cholecalciferol (VITAMIN D) 50 MCG (2000 UT) CAPS     . Coenzyme Q10 (CO Q 10 PO) Take by mouth.    Marland Kitchen lisinopril (ZESTRIL) 2.5 MG tablet TAKE 1 TABLET (2.5 MG TOTAL) BY MOUTH ONCE DAILY 90 tablet 3  . Multiple Vitamin (MULTIVITAMIN) tablet Take 1 tablet by mouth  daily.    . Omega-3 Fatty Acids (FISH OIL PO) Take 1 tablet by mouth daily.    Marland Kitchen RA KRILL OIL PO Take 750 mg by mouth 2 (two) times daily.    . rosuvastatin (CRESTOR) 5 MG tablet TAKE 1 TABLET BY MOUTH ONCE DAILY AT BEDTIME 90 tablet 0  . Turmeric 500 MG CAPS Take by mouth.     No current facility-administered medications on file prior to visit.     The following portions of the patient's history were reviewed and updated as appropriate: allergies, current medications, past family history, past medical history, past social history, past surgical history and problem list.  ROS Otherwise as in subjective above  Objective: BP 118/72   Pulse 65   Temp 97.8 F (36.6 C)   Wt 147 lb 6.4 oz (66.9 kg)   BMI 23.09 kg/m   General appearance: alert, no distress, well developed, well nourished Neck: supple, no lymphadenopathy, no thyromegaly, no masses Heart: RRR, normal S1, S2, no murmurs Lungs: CTA bilaterally, no wheezes, rhonchi, or rales Abdomen: +bs, soft, non tender, non distended, no masses, no hepatomegaly, no splenomegaly Back with out tenderness to palpation, no obvious swelling or deformity, range of motion mostly full Hips and legs nontender with normal range of motion, no swelling or deformity Legs neurovascularly  intact, negative straight leg raise, normal leg strength sensation and DTRs Pulses: 2+ radial pulses, 2+ pedal pulses, normal cap refill Ext: no edema   Assessment: Encounter Diagnoses  Name Primary?  . Chronic bilateral low back pain without sciatica Yes  . Essential hypertension, benign   . Hyperlipidemia, unspecified hyperlipidemia type   . Need for pneumococcal vaccination   . Vaccine counseling   . Post-menopausal   . Screening for osteoporosis   . Screening for cancer      Plan: Chronic back pain-discussed home exercises, stretching, continued aerobic and weightbearing exercise. Go for baseline x-ray  Screen for osteoporosis, postmenopausal-due  for bone density test. She gets this done at gynecology. She will go to do this soon and have records sent to [place]  Hypertension-continue current medication  Hyperlipidemia-continue current medication, labs today  Counseled on the pneumococcal vaccine.  Vaccine information sheet given.  Pneumococcal vaccine PPSV23 given after consent obtained.  Cancer screening:  Reviewed her 12/10/20 pap with Dr Princess Bruins Reviewed the 11/10/20 mammo I reviewed your January 2022 colonoscopy with Dr. Richmond Campbell, due to repeat in 5 years  Vaccine counseling: Advise she check her insurance for coverage for tetanus vaccine and Shingrix.  She is up-to-date on flu shot and COVID booster, up-to-date on Prevnar 13   Madelina was seen today for other.  Diagnoses and all orders for this visit:  Chronic bilateral low back pain without sciatica -     DG Lumbar Spine Complete; Future  Essential hypertension, benign -     Comprehensive metabolic panel -     CBC with Differential/Platelet -     Lipid panel -     POCT Urinalysis DIP (Proadvantage Device)  Hyperlipidemia, unspecified hyperlipidemia type -     Comprehensive metabolic panel -     Lipid panel  Need for pneumococcal vaccination  Vaccine counseling  Post-menopausal  Screening for osteoporosis  Screening for cancer  Other orders -     Pneumococcal polysaccharide vaccine 23-valent greater than or equal to 2yo subcutaneous/IM  Spent 42 minutes face to face with patient in discussion of symptoms, evaluation, plan and recommendations.    Follow up: Pending labs

## 2020-04-20 LAB — COMPREHENSIVE METABOLIC PANEL
ALT: 22 IU/L (ref 0–32)
AST: 26 IU/L (ref 0–40)
Albumin/Globulin Ratio: 2 (ref 1.2–2.2)
Albumin: 4.5 g/dL (ref 3.8–4.8)
Alkaline Phosphatase: 85 IU/L (ref 44–121)
BUN/Creatinine Ratio: 15 (ref 12–28)
BUN: 12 mg/dL (ref 8–27)
Bilirubin Total: 0.5 mg/dL (ref 0.0–1.2)
CO2: 24 mmol/L (ref 20–29)
Calcium: 9.4 mg/dL (ref 8.7–10.3)
Chloride: 106 mmol/L (ref 96–106)
Creatinine, Ser: 0.78 mg/dL (ref 0.57–1.00)
Globulin, Total: 2.3 g/dL (ref 1.5–4.5)
Glucose: 82 mg/dL (ref 65–99)
Potassium: 5.2 mmol/L (ref 3.5–5.2)
Sodium: 145 mmol/L — ABNORMAL HIGH (ref 134–144)
Total Protein: 6.8 g/dL (ref 6.0–8.5)
eGFR: 84 mL/min/{1.73_m2} (ref >59)

## 2020-04-20 LAB — CBC WITH DIFFERENTIAL/PLATELET
Basophils Absolute: 0 10*3/uL (ref 0.0–0.2)
Basos: 0 %
EOS (ABSOLUTE): 0.1 10*3/uL (ref 0.0–0.4)
Eos: 1 %
Hematocrit: 38 % (ref 34.0–46.6)
Hemoglobin: 12.7 g/dL (ref 11.1–15.9)
Immature Grans (Abs): 0 10*3/uL (ref 0.0–0.1)
Immature Granulocytes: 0 %
Lymphocytes Absolute: 1.8 10*3/uL (ref 0.7–3.1)
Lymphs: 32 %
MCH: 29.5 pg (ref 26.6–33.0)
MCHC: 33.4 g/dL (ref 31.5–35.7)
MCV: 88 fL (ref 79–97)
Monocytes Absolute: 0.5 10*3/uL (ref 0.1–0.9)
Monocytes: 9 %
Neutrophils Absolute: 3.2 10*3/uL (ref 1.4–7.0)
Neutrophils: 58 %
Platelets: 256 10*3/uL (ref 150–450)
RBC: 4.3 x10E6/uL (ref 3.77–5.28)
RDW: 13 % (ref 11.7–15.4)
WBC: 5.5 10*3/uL (ref 3.4–10.8)

## 2020-04-20 LAB — LIPID PANEL
Chol/HDL Ratio: 2.4 ratio (ref 0.0–4.4)
Cholesterol, Total: 142 mg/dL (ref 100–199)
HDL: 58 mg/dL (ref >39)
LDL Chol Calc (NIH): 69 mg/dL (ref 0–99)
Triglycerides: 78 mg/dL (ref 0–149)
VLDL Cholesterol Cal: 15 mg/dL (ref 5–40)

## 2020-04-22 ENCOUNTER — Other Ambulatory Visit: Payer: Self-pay | Admitting: Medical

## 2020-07-01 ENCOUNTER — Telehealth: Payer: Medicare PPO | Admitting: Medical

## 2020-07-01 ENCOUNTER — Other Ambulatory Visit: Payer: Self-pay

## 2020-07-01 ENCOUNTER — Encounter: Payer: Self-pay | Admitting: Medical

## 2020-07-01 VITALS — Ht 68.0 in | Wt 147.0 lb

## 2020-07-01 DIAGNOSIS — J029 Acute pharyngitis, unspecified: Secondary | ICD-10-CM

## 2020-07-01 MED ORDER — AMOXICILLIN 500 MG PO TABS
500.0000 mg | ORAL_TABLET | Freq: Three times a day (TID) | ORAL | 0 refills | Status: DC
Start: 2020-07-01 — End: 2020-12-15

## 2020-07-01 NOTE — Progress Notes (Signed)
Subjective:     Patient ID: Sherri Rangel, female   DOB: 08/12/1955, 65 y.o.   MRN: [id number]  This visit type was conducted due to national recommendations for restrictions regarding the COVID-19 Pandemic (e.g. social distancing) in an effort to limit this patient's exposure and mitigate transmission in our community.  Due to their co-morbid illnesses, this patient is at least at moderate risk for complications without adequate follow up.  This format is felt to be most appropriate for this patient at this time.    Documentation for virtual audio and video telecommunications through [place] encounter:  The patient was located at home. The provider was located in the office. The patient did consent to this visit and is aware of possible charges through their insurance for this visit.  The other persons participating in this telemedicine service were none. Time spent on call was 20 minutes and in review of previous records 20 minutes total.  This virtual service is not related to other E/M service within previous 7 days.   HPI Chief Complaint  Patient presents with  . Sore Throat    Sore thraot started Tuesday evening    Virtual consult for sore throat.  Feels strep like.  No other sx, not feeling well, has low-grade fever, but no congestion, no headache, no NVD, no cough, no sob, no change in smell and taste.  She did a home test for COVID  test today that was negative.   No strep contact.  Was around gradndaughter Sunday who had congestion but no strep contacts.  The granddaughters for result..  No sinus pain or ear pain.  When she looks at her throat it appears red with whitish gunk.  She has been known to have strep in the past.  No other aggravating or relieving factors. No other complaint.  Past Medical History:  Diagnosis Date  . Anemia    prior to 2016  . BCC (basal cell carcinoma)    right eye, LLE  . Colon polyp 08/2009   WNL, internal hemorrhoid (Dr. Earlean Shawl); also  had colonoscopy 2008 with Dr. Carlean Purl  . Encounter for routine gynecological examination    sees Dr. Uvaldo Rising  . H/O bone density study    normal 2017, 2015  . Hyperlipidemia   . Hypertension   . Iron deficiency 06/2009   negative EGD and colonoscopy 08/2009  . Menopausal state   . Squamous cell carcinoma, leg, right 2021  . Vaginal atrophy    Current Outpatient Medications on File Prior to Visit  Medication Sig Dispense Refill  . Cholecalciferol (VITAMIN D) 50 MCG (2000 UT) CAPS     . Coenzyme Q10 (CO Q 10 PO) Take by mouth.    . cycloSPORINE (RESTASIS) 0.05 % ophthalmic emulsion     . Glucosamine-Chondroit-Vit C-Mn (GLUCOSAMINE 1500 COMPLEX PO)     . lisinopril (ZESTRIL) 2.5 MG tablet TAKE 1 TABLET (2.5 MG TOTAL) BY MOUTH ONCE DAILY 90 tablet 3  . Multiple Vitamin (MULTIVITAMIN) tablet Take 1 tablet by mouth daily.    . Omega-3 Fatty Acids (FISH OIL PO) Take 1 tablet by mouth daily.    Marland Kitchen RA KRILL OIL PO Take 750 mg by mouth 2 (two) times daily.    . rosuvastatin (CRESTOR) 5 MG tablet TAKE 1 TABLET BY MOUTH ONCE DAILY AT BEDTIME 90 tablet 0  . Turmeric 500 MG CAPS Take by mouth.     No current facility-administered medications on file prior to visit.  Review of Systems As in subjective    Objective:   Physical Exam Due to coronavirus pandemic stay at home measures, patient visit was virtual and they were not examined in person.   Gen: Well-developed, well-nourished, no acute distress      Assessment:     Encounter Diagnosis  Name Primary?  . Sore throat Yes   '    Plan:     We discussed symptoms and concerns.  We discussed differential which could include strep versus viral pharyngitis.  She had a negative COVID test today at home.  We discussed Cent[facility] criteria and currently she has some swollen nodes and sore throat and questionable fever but no headache.  She will try and send me some pictures of her throat through MyChart.  We discussed supportive care  including salt water gargles, warm fluids, Tylenol for pain and fever, sore throat spray over-the-counter.  We discussed if she develops worsening fever over 101, headache, swollen nodes, or sore throat and she can begin amoxicillin below.  Otherwise we will treat this supportively as viral.  We discussed usual timeframe to see improvements.  I will await her pictures  [name] was seen today for sore throat.  Diagnoses and all orders for this visit:  Sore throat  Other orders -     amoxicillin (AMOXIL) 500 MG tablet; Take 1 tablet (500 mg total) by mouth in the morning, at noon, and at bedtime.  f/u prn

## 2020-07-15 ENCOUNTER — Other Ambulatory Visit: Payer: Self-pay | Admitting: Medical

## 2020-10-20 ENCOUNTER — Other Ambulatory Visit: Payer: Self-pay | Admitting: Medical

## 2020-11-15 LAB — HM MAMMOGRAPHY

## 2020-11-16 ENCOUNTER — Encounter: Payer: Self-pay | Admitting: Internal Medicine

## 2020-11-16 ENCOUNTER — Encounter: Payer: Self-pay | Admitting: Obstetrics & Gynecology

## 2020-12-15 ENCOUNTER — Ambulatory Visit (INDEPENDENT_AMBULATORY_CARE_PROVIDER_SITE_OTHER): Payer: Medicare PPO | Admitting: Obstetrics & Gynecology

## 2020-12-15 ENCOUNTER — Other Ambulatory Visit: Payer: Self-pay

## 2020-12-15 ENCOUNTER — Ambulatory Visit: Payer: Medicare PPO | Admitting: Obstetrics & Gynecology

## 2020-12-15 ENCOUNTER — Encounter: Payer: Self-pay | Admitting: Obstetrics & Gynecology

## 2020-12-15 VITALS — BP 120/80 | HR 66 | Resp 16 | Ht 66.75 in | Wt 151.0 lb

## 2020-12-15 DIAGNOSIS — R350 Frequency of micturition: Secondary | ICD-10-CM | POA: Diagnosis not present

## 2020-12-15 DIAGNOSIS — Z01419 Encounter for gynecological examination (general) (routine) without abnormal findings: Secondary | ICD-10-CM

## 2020-12-15 DIAGNOSIS — Z9189 Other specified personal risk factors, not elsewhere classified: Secondary | ICD-10-CM | POA: Diagnosis not present

## 2020-12-15 DIAGNOSIS — Z8041 Family history of malignant neoplasm of ovary: Secondary | ICD-10-CM | POA: Diagnosis not present

## 2020-12-15 DIAGNOSIS — Z780 Asymptomatic menopausal state: Secondary | ICD-10-CM

## 2020-12-15 DIAGNOSIS — Z13820 Encounter for screening for osteoporosis: Secondary | ICD-10-CM

## 2020-12-15 DIAGNOSIS — Z23 Encounter for immunization: Secondary | ICD-10-CM

## 2020-12-15 LAB — URINALYSIS, COMPLETE W/RFL CULTURE
Bacteria, UA: NONE SEEN /HPF
Bilirubin Urine: NEGATIVE
Casts: NONE SEEN /LPF
Crystals: NONE SEEN /HPF
Glucose, UA: NEGATIVE
Hgb urine dipstick: NEGATIVE
Hyaline Cast: NONE SEEN /LPF
Ketones, ur: NEGATIVE
Leukocyte Esterase: NEGATIVE
Nitrites, Initial: NEGATIVE
Protein, ur: NEGATIVE
RBC / HPF: NONE SEEN /HPF (ref 0–2)
Specific Gravity, Urine: 1.003 (ref 1.001–1.035)
WBC, UA: NONE SEEN /HPF (ref 0–5)
Yeast: NONE SEEN /HPF
pH: 5.5 (ref 5.0–8.0)

## 2020-12-15 LAB — NO CULTURE INDICATED

## 2020-12-15 NOTE — Progress Notes (Addendum)
Sherri Rangel 1955-08-08 [id number]   History:    65 y.o. G3P3L3 Married.  Retired but Civil engineer, contracting as a Art therapist.   RP:  Established patient presenting for annual gyn exam    HPI: Postmenopausal, well on no hormone replacement therapy.  No postmenopausal bleeding.  No pelvic pain.  No pain with intercourse.  Pelvic US 11/2019 Rt ovarian remnant follicle decreased to 4.7 mm.  Urine and bowel movements normal.  Breasts normal.  Screening mammogram 10/2020.  Body mass index 23.83.  Regular fitness activities.  Health labs with family physician.  BD normal in 2017, will repeat now.  Colono 02/2020.  Past medical history,surgical history, family history and social history were all reviewed and documented in the EPIC chart.  Gynecologic History No LMP recorded. Patient is postmenopausal.  Obstetric History OB History  Gravida Para Term Preterm AB Living  3 3       3   SAB IAB Ectopic Multiple Live Births               # Outcome Date GA Lbr Len/2nd Weight Sex Delivery Anes PTL Lv  3 Para           2 Para           1 Para              ROS: A ROS was performed and pertinent positives and negatives are included in the history.  GENERAL: No fevers or chills. HEENT: No change in vision, no earache, sore throat or sinus congestion. NECK: No pain or stiffness. CARDIOVASCULAR: No chest pain or pressure. No palpitations. PULMONARY: No shortness of breath, cough or wheeze. GASTROINTESTINAL: No abdominal pain, nausea, vomiting or diarrhea, melena or bright red blood per rectum. GENITOURINARY: No urinary frequency, urgency, hesitancy or dysuria. MUSCULOSKELETAL: No joint or muscle pain, no back pain, no recent trauma. DERMATOLOGIC: No rash, no itching, no lesions. ENDOCRINE: No polyuria, polydipsia, no heat or cold intolerance. No recent change in weight. HEMATOLOGICAL: No anemia or easy bruising or bleeding. NEUROLOGIC: No headache, seizures, numbness, tingling or weakness. PSYCHIATRIC: No depression,  no loss of interest in normal activity or change in sleep pattern.     Exam:   BP 120/80   Pulse 66   Resp 16   Ht 5' 6.75" (1.695 m)   Wt 151 lb (68.5 kg)   BMI 23.83 kg/m   Body mass index is 23.83 kg/m.  General appearance : Well developed well nourished female. No acute distress HEENT: Eyes: no retinal hemorrhage or exudates,  Neck supple, trachea midline, no carotid bruits, no thyroidmegaly Lungs: Clear to auscultation, no rhonchi or wheezes, or rib retractions  Heart: Regular rate and rhythm, no murmurs or gallops Breast:Examined in sitting and supine position were symmetrical in appearance, no palpable masses or tenderness,  no skin retraction, no nipple inversion, no nipple discharge, no skin discoloration, no axillary or supraclavicular lymphadenopathy Abdomen: no palpable masses or tenderness, no rebound or guarding Extremities: no edema or skin discoloration or tenderness  Pelvic: Vulva: Normal             Vagina: No gross lesions or discharge  Cervix: No gross lesions or discharge  Uterus  AV, normal size, shape and consistency, non-tender and mobile  Adnexa  Without masses or tenderness  Anus: Normal  U/A Negative   Assessment/Plan:  65 y.o. female for annual exam   1. Wellness gyn exam with High risk of ovarian cancer Postmenopausal, well on  no hormone replacement therapy.  No postmenopausal bleeding.  No pelvic pain.  No pain with intercourse.  Pelvic US 11/2019 Rt ovarian remnant follicle decreased to 4.7 mm.  Urine and bowel movements normal.  Breasts normal.  Screening mammogram 10/2020.  Body mass index 23.83.  Regular fitness activities.  Health labs with family physician.  BD normal in 2017, will repeat now.  Colono 02/2020.  2. Postmenopausal Postmenopausal, well on no hormone replacement therapy.  No postmenopausal bleeding.    3. Family history of ovarian cancer Will screen with a Pelvic US at f/u. - US Transvaginal Non-OB; Future  4. Urinary  frequency U/A Neg, reassured. - Urinalysis,Complete w/RFL Culture  5. Needs flu shot    Princess Bruins MD, 10:26 AM 12/15/2020

## 2020-12-17 ENCOUNTER — Encounter: Payer: Self-pay | Admitting: Obstetrics & Gynecology

## 2020-12-28 ENCOUNTER — Encounter: Payer: Self-pay | Admitting: Internal Medicine

## 2021-01-04 ENCOUNTER — Ambulatory Visit (INDEPENDENT_AMBULATORY_CARE_PROVIDER_SITE_OTHER): Payer: Medicare PPO

## 2021-01-04 ENCOUNTER — Other Ambulatory Visit: Payer: Self-pay | Admitting: Obstetrics & Gynecology

## 2021-01-04 ENCOUNTER — Other Ambulatory Visit: Payer: Self-pay

## 2021-01-04 DIAGNOSIS — Z780 Asymptomatic menopausal state: Secondary | ICD-10-CM

## 2021-01-04 DIAGNOSIS — M8589 Other specified disorders of bone density and structure, multiple sites: Secondary | ICD-10-CM | POA: Diagnosis not present

## 2021-01-04 DIAGNOSIS — Z13820 Encounter for screening for osteoporosis: Secondary | ICD-10-CM

## 2021-01-24 ENCOUNTER — Ambulatory Visit: Payer: Medicare PPO | Admitting: Medical

## 2021-01-24 ENCOUNTER — Other Ambulatory Visit: Payer: Self-pay

## 2021-01-24 VITALS — BP 110/80 | HR 68 | Wt 151.2 lb

## 2021-01-24 DIAGNOSIS — Z136 Encounter for screening for cardiovascular disorders: Secondary | ICD-10-CM | POA: Diagnosis not present

## 2021-01-24 DIAGNOSIS — E785 Hyperlipidemia, unspecified: Secondary | ICD-10-CM | POA: Diagnosis not present

## 2021-01-24 DIAGNOSIS — I10 Essential (primary) hypertension: Secondary | ICD-10-CM

## 2021-01-24 DIAGNOSIS — M549 Dorsalgia, unspecified: Secondary | ICD-10-CM

## 2021-01-24 DIAGNOSIS — M419 Scoliosis, unspecified: Secondary | ICD-10-CM

## 2021-01-24 DIAGNOSIS — M25561 Pain in right knee: Secondary | ICD-10-CM

## 2021-01-24 DIAGNOSIS — Z7189 Other specified counseling: Secondary | ICD-10-CM | POA: Diagnosis not present

## 2021-01-24 DIAGNOSIS — G8929 Other chronic pain: Secondary | ICD-10-CM

## 2021-01-24 DIAGNOSIS — M25562 Pain in left knee: Secondary | ICD-10-CM

## 2021-01-24 MED ORDER — ROSUVASTATIN CALCIUM 5 MG PO TABS: 5.0000 mg | ORAL_TABLET | Freq: Every day | ORAL | 3 refills | Status: DC

## 2021-01-24 MED ORDER — LISINOPRIL 2.5 MG PO TABS: 2.5000 mg | ORAL_TABLET | Freq: Every day | ORAL | 3 refills | Status: DC

## 2021-01-24 NOTE — Progress Notes (Signed)
Subjective:  Sherri Rangel is a 65 y.o. female who presents for Chief Complaint  Patient presents with   med check    Fasting med check      Here for chronic disease follow-up.  Hypertension-compliant with medication without complaint  Hyperlipidemia-compliant with medication plan  She continues to have ongoing low back and bilateral knee pains.  No prior specialist for this.  No worse than prior but just continues to have pains in these areas.  No swelling.  No injury or trauma  She had a bone density test done this past year with gynecology.  Prior bone density test was normal, but this 1 was osteopenic.  She does continue on vitamin D supplement.  She sees gynecology this week for vaginal ultrasound for a different issue.  She has not done advanced directives  No other aggravating or relieving factors.    No other c/o.  Past Medical History:  Diagnosis Date   Anemia    prior to 2016   BCC (basal cell carcinoma)    right eye, LLE   Colon polyp 08/2009   WNL, internal hemorrhoid (Dr. Earlean Shawl); also had colonoscopy 2008 with Dr. Carlean Purl   Encounter for routine gynecological examination    sees Dr. Uvaldo Rising   H/O bone density study    normal 2017, 2015   Hyperlipidemia    Hypertension    Iron deficiency 06/2009   negative EGD and colonoscopy 08/2009   Menopausal state    Squamous cell carcinoma, leg, right 2021   Vaginal atrophy    Current Outpatient Medications on File Prior to Visit  Medication Sig Dispense Refill   Cholecalciferol (VITAMIN D) 50 MCG (2000 UT) CAPS      Coenzyme Q10 (CO Q 10 PO) Take by mouth.     cycloSPORINE (RESTASIS) 0.05 % ophthalmic emulsion      Glucosamine-Chondroit-Vit C-Mn (GLUCOSAMINE 1500 COMPLEX PO)      Multiple Vitamin (MULTIVITAMIN) tablet Take 1 tablet by mouth daily.     RA KRILL OIL PO Take 750 mg by mouth daily.     Turmeric 500 MG CAPS Take by mouth.     No current facility-administered medications on file prior to  visit.     The following portions of the patient's history were reviewed and updated as appropriate: allergies, current medications, past family history, past medical history, past social history, past surgical history and problem list.  ROS Otherwise as in subjective above  Objective: BP 110/80   Pulse 68   Wt 151 lb 3.2 oz (68.6 kg)   BMI 23.86 kg/m   General appearance: alert, no distress, well developed, well nourished Heart: RRR, normal S1, S2, no murmurs Pulses: 2+ radial pulses, 2+ pedal pulses, normal cap refill Ext: no edema   Assessment: Encounter Diagnoses  Name Primary?   Hyperlipidemia, unspecified hyperlipidemia type Yes   Essential hypertension, benign    Screening for heart disease    Advanced care planning/counseling discussion    Scoliosis, unspecified scoliosis type, unspecified spinal region    Chronic pain of both knees    Chronic back pain, unspecified back location, unspecified back pain laterality      Plan: Hyperlipidemia-continue medication, labs today  Hypertension-continue current medication  Screening for heart disease-consider CT coronary calcium score test.  She will let me know if she wants to do this  We discussed advance care planning.  She will work on this.  Chronic knee pain, chronic back pain-referral to Ortho  Sherri Rangel was seen today for med check.  Diagnoses and all orders for this visit:  Hyperlipidemia, unspecified hyperlipidemia type -     Lipid panel -     ALT  Essential hypertension, benign  Screening for heart disease  Advanced care planning/counseling discussion  Scoliosis, unspecified scoliosis type, unspecified spinal region -     AMB referral to orthopedics  Chronic pain of both knees -     AMB referral to orthopedics  Chronic back pain, unspecified back location, unspecified back pain laterality -     AMB referral to orthopedics  Other orders -     rosuvastatin (CRESTOR) 5 MG tablet; Take 1  tablet (5 mg total) by mouth at bedtime. -     lisinopril (ZESTRIL) 2.5 MG tablet; Take 1 tablet (2.5 mg total) by mouth daily.   Follow up: pending referral

## 2021-01-25 ENCOUNTER — Encounter: Payer: Self-pay | Admitting: Obstetrics & Gynecology

## 2021-01-25 ENCOUNTER — Ambulatory Visit (INDEPENDENT_AMBULATORY_CARE_PROVIDER_SITE_OTHER): Payer: Medicare PPO | Admitting: Obstetrics & Gynecology

## 2021-01-25 ENCOUNTER — Ambulatory Visit (INDEPENDENT_AMBULATORY_CARE_PROVIDER_SITE_OTHER): Payer: Medicare PPO

## 2021-01-25 VITALS — BP 118/70

## 2021-01-25 DIAGNOSIS — Z8041 Family history of malignant neoplasm of ovary: Secondary | ICD-10-CM

## 2021-01-25 DIAGNOSIS — M85851 Other specified disorders of bone density and structure, right thigh: Secondary | ICD-10-CM | POA: Diagnosis not present

## 2021-01-25 DIAGNOSIS — N854 Malposition of uterus: Secondary | ICD-10-CM | POA: Diagnosis not present

## 2021-01-25 LAB — ALT: ALT: 17 IU/L (ref 0–32)

## 2021-01-25 LAB — LIPID PANEL
Chol/HDL Ratio: 2.3 ratio (ref 0.0–4.4)
Cholesterol, Total: 148 mg/dL (ref 100–199)
HDL: 63 mg/dL (ref >39)
LDL Chol Calc (NIH): 74 mg/dL (ref 0–99)
Triglycerides: 52 mg/dL (ref 0–149)
VLDL Cholesterol Cal: 11 mg/dL (ref 5–40)

## 2021-01-25 NOTE — Progress Notes (Signed)
    Sherri Rangel 10/12/1955 [id number]        65 y.o.  G3P3L3  RP: Fam h/o Ovarian Ca for Pelvic US screening and Counseling on Bone Density result  HPI: Fam h/o Ovarian Ca for Pelvic US screening.  No pelvic pain.  No PMB.  Bone density 01/17/2021 showed Osteopenia with T-Score at -1.4.  Physically active.  Taking a multivitamin.   OB History  Gravida Para Term Preterm AB Living  3 3       3   SAB IAB Ectopic Multiple Live Births               # Outcome Date GA Lbr Len/2nd Weight Sex Delivery Anes PTL Lv  3 Para           2 Para           1 Para             Past medical history,surgical history, problem list, medications, allergies, family history and social history were all reviewed and documented in the EPIC chart.   Directed ROS with pertinent positives and negatives documented in the history of present illness/assessment and plan.  Exam:  Vitals:   01/25/21 1536  BP: 118/70   General appearance:  Normal  Pelvic US today: Comparison is made with previous scan 11/2019:  Small retroverted uterus with no myometrial mass seen.  The overall uterine size is measured at 5.55 x 4.14 x 2.53 cm.  The Endometrial line is thin and symmetrical at 2.0 mm with no thickening or mass seen.  Both ovaries are small with atrophic appearance.  The residual follicle previously seen on the right ovary is not seen today.  No adnexal mass.  No free fluid in the pelvis.  Bone Density 01/17/2021:  Osteopenia with T-Score -1.4 at the Rt Femoral Neck (AP Spine -1.1 and other sites normal).  Significant decrease at all sites.  Frax Not Increased.   Assessment/Plan:  65 y.o. G3P3   1. Family history of ovarian cancer Fam h/o Ovarian Ca for Pelvic US screening.  No pelvic pain.  No PMB.  Pelvic US findings thoroughly reviewed with patient.  Ovaries atrophic and normal.  No Adnexal mass.  No FF.  Patient reassured.  2. Osteopenia of neck of right femur  Bone density 01/17/2021 Osteopenia with  T-Score -1.4 at the Rt Femoral Neck (AP Spine -1.1 and other sites normal). Significant decrease at all sites.  Frax Not Increased. Physically active.  Taking a multivitamin.  Counseling on Osteopenia done.  Recommend to increase weight bearing physical activities to everyday.  Ca++ total of 1.5 g/d and Vit Supplement to continue.  Will repeat BD in 2 years.  Princess Bruins MD, 3:50 PM 01/25/2021

## 2021-01-26 ENCOUNTER — Telehealth: Payer: Self-pay | Admitting: Medical

## 2021-01-26 NOTE — Telephone Encounter (Signed)
Pt called and wants to see if she can get her referral that was done for her back pain switched to Dr. Melven Sartorius office their number is [phone]

## 2021-01-27 ENCOUNTER — Other Ambulatory Visit: Payer: Medicare PPO

## 2021-01-27 ENCOUNTER — Other Ambulatory Visit: Payer: Medicare PPO | Admitting: Obstetrics & Gynecology

## 2021-04-08 DIAGNOSIS — M5459 Other low back pain: Secondary | ICD-10-CM | POA: Diagnosis not present

## 2021-04-08 DIAGNOSIS — I10 Essential (primary) hypertension: Secondary | ICD-10-CM | POA: Diagnosis not present

## 2021-04-19 ENCOUNTER — Encounter: Payer: Self-pay | Admitting: Internal Medicine

## 2021-06-10 DIAGNOSIS — D2272 Melanocytic nevi of left lower limb, including hip: Secondary | ICD-10-CM | POA: Diagnosis not present

## 2021-06-10 DIAGNOSIS — L820 Inflamed seborrheic keratosis: Secondary | ICD-10-CM | POA: Diagnosis not present

## 2021-06-10 DIAGNOSIS — L570 Actinic keratosis: Secondary | ICD-10-CM | POA: Diagnosis not present

## 2021-06-10 DIAGNOSIS — X32XXXD Exposure to sunlight, subsequent encounter: Secondary | ICD-10-CM | POA: Diagnosis not present

## 2021-06-10 DIAGNOSIS — D485 Neoplasm of uncertain behavior of skin: Secondary | ICD-10-CM | POA: Diagnosis not present

## 2021-06-10 DIAGNOSIS — Z1283 Encounter for screening for malignant neoplasm of skin: Secondary | ICD-10-CM | POA: Diagnosis not present

## 2021-06-10 DIAGNOSIS — D225 Melanocytic nevi of trunk: Secondary | ICD-10-CM | POA: Diagnosis not present

## 2021-07-05 DIAGNOSIS — L988 Other specified disorders of the skin and subcutaneous tissue: Secondary | ICD-10-CM | POA: Diagnosis not present

## 2021-07-05 DIAGNOSIS — D485 Neoplasm of uncertain behavior of skin: Secondary | ICD-10-CM | POA: Diagnosis not present

## 2021-07-29 ENCOUNTER — Ambulatory Visit (INDEPENDENT_AMBULATORY_CARE_PROVIDER_SITE_OTHER): Payer: Medicare PPO

## 2021-07-29 VITALS — BP 122/64 | HR 57 | Temp 97.7°F | Ht 67.5 in | Wt 154.6 lb

## 2021-07-29 DIAGNOSIS — Z0000 Encounter for general adult medical examination without abnormal findings: Secondary | ICD-10-CM | POA: Diagnosis not present

## 2021-07-29 NOTE — Progress Notes (Signed)
Subjective:   Sherri Rangel is a 66 y.o. female who presents for an Initial Medicare Annual Wellness Visit.  Review of Systems     Cardiac Risk Factors include: advanced age (>93men, >14 women);dyslipidemia;hypertension     Objective:    Today's Vitals   07/29/21 0805  BP: 122/64  Pulse: (!) 57  Temp: 97.7 F (36.5 C)  TempSrc: Oral  SpO2: 95%  Weight: 154 lb 9.6 oz (70.1 kg)  Height: 5' 7.5" (1.715 m)   Body mass index is 23.86 kg/m.     07/29/2021    8:14 AM 01/09/2019   11:57 AM  Advanced Directives  Does Patient Have a Medical Advance Directive? No No  Would patient like information on creating a medical advance directive? No - Patient declined     Current Medications (verified) Outpatient Encounter Medications as of 07/29/2021  Medication Sig   Cholecalciferol (VITAMIN D) 50 MCG (2000 UT) CAPS    Coenzyme Q10 (CO Q 10 PO) Take by mouth.   cycloSPORINE (RESTASIS) 0.05 % ophthalmic emulsion    Glucosamine-Chondroit-Vit C-Mn (GLUCOSAMINE 1500 COMPLEX PO)    lisinopril (ZESTRIL) 2.5 MG tablet Take 1 tablet (2.5 mg total) by mouth daily.   Multiple Vitamin (MULTIVITAMIN) tablet Take 1 tablet by mouth daily.   RA KRILL OIL PO Take 750 mg by mouth daily.   rosuvastatin (CRESTOR) 5 MG tablet Take 1 tablet (5 mg total) by mouth at bedtime.   Turmeric 500 MG CAPS Take by mouth.   No facility-administered encounter medications on file as of 07/29/2021.    Allergies (verified) Patient has no known allergies.   History: Past Medical History:  Diagnosis Date   Anemia    prior to 2016   BCC (basal cell carcinoma)    right eye, LLE   Colon polyp 08/2009   WNL, internal hemorrhoid (Dr. Kinnie Scales); also had colonoscopy 2008 with Dr. Leone Payor   Encounter for routine gynecological examination    sees Dr. Reynaldo Minium   H/O bone density study    normal 2017, 2015   Hyperlipidemia    Hypertension    Iron deficiency 06/2009   negative EGD and colonoscopy 08/2009    Menopausal state    Squamous cell carcinoma, leg, right 2021   Vaginal atrophy    Past Surgical History:  Procedure Laterality Date   BREAST SURGERY     RIGHT BREAST BIOPSY/FCD   COLONOSCOPY  09/2014   Dr. Kinnie Scales, prior 2011.   due back 2021, hx/o polyps   skin tags     Family History  Problem Relation Age of Onset   Cancer Mother 65       ovarian cancer   Hypertension Mother    Osteoporosis Mother    Cancer Father        esophageal   Alcohol abuse Father    Hyperlipidemia Father    Cancer Brother        MULTIPLE MYELOMA   Diabetes Maternal Aunt    Diabetes Cousin    Diabetes Cousin    Heart disease Paternal Grandfather    Social History   Socioeconomic History   Marital status: Married    Spouse name: Not on file   Number of children: Not on file   Years of education: Not on file   Highest education level: Not on file  Occupational History   Occupation: Warden/ranger at [employer]    Employer: [employer]  Tobacco Use   Smoking status: Never  Smokeless tobacco: Never  Vaping Use   Vaping Use: Never used  Substance and Sexual Activity   Alcohol use: Yes    Comment: 0-4 a week   Drug use: No   Sexual activity: Yes    Birth control/protection: Post-menopausal    Comment: <5, older than 16  Other Topics Concern   Not on file  Social History Narrative   Married, retired in 2013, 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   Has 3 children, 2 grandchildren Foster [place] and [place] , that live in [place], [place].  Exercise - does group exercise, step aerobics, weights, eating healthy.  12/2018   Social Determinants of Health   Financial Resource Strain: Low Risk  (07/29/2021)   Overall Financial Resource Strain (CARDIA)    Difficulty of Paying Living Expenses: Not hard at all  Food Insecurity: No Food Insecurity (07/29/2021)   Hunger Vital Sign    Worried About Running Out of Food in the Last Year: Never true    Ran Out of Food in the Last  Year: Never true  Transportation Needs: No Transportation Needs (07/29/2021)   PRAPARE - Administrator, Civil Service (Medical): No    Lack of Transportation (Non-Medical): No  Physical Activity: Sufficiently Active (07/29/2021)   Exercise Vital Sign    Days of Exercise per Week: 5 days    Minutes of Exercise per Session: 70 min  Stress: No Stress Concern Present (07/29/2021)   [employer] of Occupational Health - Occupational Stress Questionnaire    Feeling of Stress : Not at all  Social Connections: Not on file    Tobacco Counseling Counseling given: Not Answered   Clinical Intake:  Pre-visit preparation completed: Yes  Pain : No/denies pain     Nutritional Status: BMI of 19-24  Normal Nutritional Risks: None Diabetes: No  How often do you need to have someone help you when you read instructions, pamphlets, or other written materials from your doctor or pharmacy?: 1 - Never What is the last grade level you completed in school?: college  Diabetic? no  Interpreter Needed?: No  Information entered by :: NAllen LPN   Activities of Daily Living    07/29/2021    8:16 AM 07/28/2021   11:43 AM  In your present state of health, do you have any difficulty performing the following activities:  Hearing? 0 0  Vision? 0 0  Difficulty concentrating or making decisions? 0 0  Walking or climbing stairs? 0 0  Dressing or bathing? 0 0  Doing errands, shopping? 0 0  Preparing Food and eating ? N N  Using the Toilet? N N  In the past six months, have you accidently leaked urine? Y Y  Do you have problems with loss of bowel control? N N  Managing your Medications? N N  Managing your Finances? N N  Housekeeping or managing your Housekeeping? N N    Patient Care Team: Tysinger, Kermit Balo, PA-C as PCP - General (Family Medicine)  Indicate any recent Medical Services you may have received from other than [facility] providers in the past year (date may be approximate).      Assessment:   This is a routine wellness examination for [facility].  Hearing/Vision screen Vision Screening - Comments:: Regular eye exams, [facility]  Dietary issues and exercise activities discussed: Current Exercise Habits: Structured exercise class, Type of exercise: calisthenics;walking, Time (Minutes): > 60, Frequency (Times/Week): 5, Weekly Exercise (Minutes/Week): 0   Goals Addressed  This Visit's Progress    Patient Stated       07/29/2021, maintain and try not to gain weight       Depression Screen    07/29/2021    8:16 AM 01/24/2021    9:26 AM 07/01/2020   12:18 PM 01/09/2019   11:55 AM  PHQ 2/9 Scores  PHQ - 2 Score 0 0 0 0    Fall Risk    07/29/2021    8:15 AM 07/28/2021   11:43 AM 01/24/2021    9:26 AM 01/09/2019   11:55 AM  Fall Risk   Falls in the past year? $RemoveBe'1 1 1 'ESkRIoCiD$ 0  Comment slipped on step     Number falls in past yr: 1 0 0   Injury with Fall? 0 1 0   Risk for fall due to : Medication side effect  Impaired balance/gait   Follow up Falls evaluation completed;Education provided;Falls prevention discussed  Falls evaluation completed     FALL RISK PREVENTION PERTAINING TO THE HOME:  Any stairs in or around the home? Yes  If so, are there any without handrails? No  Home free of loose throw rugs in walkways, pet beds, electrical cords, etc? Yes  Adequate lighting in your home to reduce risk of falls? Yes   ASSISTIVE DEVICES UTILIZED TO PREVENT FALLS:  Life alert? No  Use of a cane, walker or w/c? No  Grab bars in the bathroom? No  Shower chair or bench in shower? No  Elevated toilet seat or a handicapped toilet? No   TIMED UP AND GO:  Was the test performed? No .    Gait steady and fast without use of assistive device  Cognitive Function:        07/29/2021    8:17 AM  6CIT Screen  What Year? 0 points  What month? 0 points  What time? 0 points  Count back from 20 0 points  Months in reverse 0 points  Repeat phrase 0 points   Total Score 0 points    Immunizations Immunization History  Administered Date(s) Administered   Fluad Quad(high Dose 65+) 12/05/2018, 12/15/2020   Influenza Split 10/31/2011   Influenza,inj,Quad PF,6+ Mos 11/06/2013, 10/27/2015, 11/13/2016, 11/23/2017   Influenza-Unspecified 11/28/2019   PFIZER(Purple Top)SARS-COV-2 Vaccination 03/28/2019, 04/22/2019, 12/01/2019, 09/01/2020   Pfizer Covid-19 Vaccine Bivalent Booster 5y-11y 02/01/2021   Pneumococcal Conjugate-13 01/09/2019   Pneumococcal Polysaccharide-23 04/19/2020   Tdap 03/01/2007   Zoster Recombinat (Shingrix) 04/18/2021, 07/04/2021   Zoster, Live 10/24/2012    TDAP status: Due, Education has been provided regarding the importance of this vaccine. Advised may receive this vaccine at local pharmacy or Health Dept. Aware to provide a copy of the vaccination record if obtained from local pharmacy or Health Dept. Verbalized acceptance and understanding.  Flu Vaccine status: Up to date  Pneumococcal vaccine status: Up to date  Covid-19 vaccine status: Completed vaccines  Qualifies for Shingles Vaccine? Yes   Zostavax completed Yes   Shingrix Completed?: Yes  Screening Tests Health Maintenance  Topic Date Due   TETANUS/TDAP  02/28/2017   INFLUENZA VACCINE  09/20/2021   MAMMOGRAM  11/16/2022   COLONOSCOPY (Pts 45-81yrs Insurance coverage will need to be confirmed)  02/23/2030   Pneumonia Vaccine 69+ Years old  Completed   DEXA SCAN  Completed   COVID-19 Vaccine  Completed   Hepatitis C Screening  Completed   Zoster Vaccines- Shingrix  Completed   HPV VACCINES  Aged Out    Health Maintenance  Health Maintenance Due  Topic Date Due   TETANUS/TDAP  02/28/2017    Colorectal cancer screening: Type of screening: Colonoscopy. Completed 02/24/2020. Repeat every 5 years  Mammogram status: Completed 11/15/2020. Repeat every year  Bone Density status: Completed 01/04/2021.   Lung Cancer Screening: (Low Dose CT Chest  recommended if Age 37-80 years, 30 pack-year currently smoking OR have quit w/in 15years.) does not qualify.   Lung Cancer Screening Referral: no  Additional Screening:  Hepatitis C Screening: does qualify; Completed 10/11/2012  Vision Screening: Recommended annual ophthalmology exams for early detection of glaucoma and other disorders of the eye. Is the patient up to date with their annual eye exam?  Yes  Who is the provider or what is the name of the office in which the patient attends annual eye exams? [facility] If pt is not established with a provider, would they like to be referred to a provider to establish care? No .   Dental Screening: Recommended annual dental exams for proper oral hygiene  Community Resource Referral / Chronic Care Management: CRR required this visit?  No   CCM required this visit?  No      Plan:     I have personally reviewed and noted the following in the patient's chart:   Medical and social history Use of alcohol, tobacco or illicit drugs  Current medications and supplements including opioid prescriptions. Patient is not currently taking opioid prescriptions. Functional ability and status Nutritional status Physical activity Advanced directives List of other physicians Hospitalizations, surgeries, and ER visits in previous 12 months Vitals Screenings to include cognitive, depression, and falls Referrals and appointments  In addition, I have reviewed and discussed with patient certain preventive protocols, quality metrics, and best practice recommendations. A written personalized care plan for preventive services as well as general preventive health recommendations were provided to patient.     Kellie Simmering, LPN   [id number]/0/3009   Nurse Notes: none

## 2021-07-29 NOTE — Patient Instructions (Signed)
Sherri Rangel , Thank you for taking time to come for your Medicare Wellness Visit. I appreciate your ongoing commitment to your health goals. Please review the following plan we discussed and let me know if I can assist you in the future.   Screening recommendations/referrals: Colonoscopy: completed 02/24/2020 Mammogram: completed 11/15/2020 Bone Density: completed 01/04/2021 Recommended yearly ophthalmology/optometry visit for glaucoma screening and checkup Recommended yearly dental visit for hygiene and checkup  Vaccinations: Influenza vaccine: due 09/20/2021 Pneumococcal vaccine: completed 04/19/2020 Tdap vaccine: due Shingles vaccine: completed   Covid-19: 01/22/2021, 09/01/2020, 12/01/2019, 04/22/2019, 03/28/2019  Advanced directives: Advance directive discussed with you today. Even though you declined this today please call our office should you change your mind and we can give you the proper paperwork for you to fill out.  Conditions/risks identified: none  Next appointment: Follow up in one year for your annual wellness visit    Preventive Care 65 Years and Older, Female Preventive care refers to lifestyle choices and visits with your health care provider that can promote health and wellness. What does preventive care include? A yearly physical exam. This is also called an annual well check. Dental exams once or twice a year. Routine eye exams. Ask your health care provider how often you should have your eyes checked. Personal lifestyle choices, including: Daily care of your teeth and gums. Regular physical activity. Eating a healthy diet. Avoiding tobacco and drug use. Limiting alcohol use. Practicing safe sex. Taking low-dose aspirin every day. Taking vitamin and mineral supplements as recommended by your health care provider. What happens during an annual well check? The services and screenings done by your health care provider during your annual well check will depend on your  age, overall health, lifestyle risk factors, and family history of disease. Counseling  Your health care provider may ask you questions about your: Alcohol use. Tobacco use. Drug use. Emotional well-being. Home and relationship well-being. Sexual activity. Eating habits. History of falls. Memory and ability to understand (cognition). Work and work Statistician. Reproductive health. Screening  You may have the following tests or measurements: Height, weight, and BMI. Blood pressure. Lipid and cholesterol levels. These may be checked every 5 years, or more frequently if you are over 77 years old. Skin check. Lung cancer screening. You may have this screening every year starting at age 24 if you have a 30-pack-year history of smoking and currently smoke or have quit within the past 15 years. Fecal occult blood test (FOBT) of the stool. You may have this test every year starting at age 43. Flexible sigmoidoscopy or colonoscopy. You may have a sigmoidoscopy every 5 years or a colonoscopy every 10 years starting at age 50. Hepatitis C blood test. Hepatitis B blood test. Sexually transmitted disease (STD) testing. Diabetes screening. This is done by checking your blood sugar (glucose) after you have not eaten for a while (fasting). You may have this done every 1-3 years. Bone density scan. This is done to screen for osteoporosis. You may have this done starting at age 7. Mammogram. This may be done every 1-2 years. Talk to your health care provider about how often you should have regular mammograms. Talk with your health care provider about your test results, treatment options, and if necessary, the need for more tests. Vaccines  Your health care provider may recommend certain vaccines, such as: Influenza vaccine. This is recommended every year. Tetanus, diphtheria, and acellular pertussis (Tdap, Td) vaccine. You may need a Td booster every 10 years. Zoster  vaccine. You may need this after  age 12. Pneumococcal 13-valent conjugate (PCV13) vaccine. One dose is recommended after age 51. Pneumococcal polysaccharide (PPSV23) vaccine. One dose is recommended after age 47. Talk to your health care provider about which screenings and vaccines you need and how often you need them. This information is not intended to replace advice given to you by your health care provider. Make sure you discuss any questions you have with your health care provider. Document Released: 03/05/2015 Document Revised: 10/27/2015 Document Reviewed: 12/08/2014 Elsevier Interactive Patient Education  2017 [place] Prevention in the Home Falls can cause injuries. They can happen to people of all ages. There are many things you can do to make your home safe and to help prevent falls. What can I do on the outside of my home? Regularly fix the edges of walkways and driveways and fix any cracks. Remove anything that might make you trip as you walk through a door, such as a raised step or threshold. Trim any bushes or trees on the path to your home. Use bright outdoor lighting. Clear any walking paths of anything that might make someone trip, such as rocks or tools. Regularly check to see if handrails are loose or broken. Make sure that both sides of any steps have handrails. Any raised decks and porches should have guardrails on the edges. Have any leaves, snow, or ice cleared regularly. Use sand or salt on walking paths during winter. Clean up any spills in your garage right away. This includes oil or grease spills. What can I do in the bathroom? Use night lights. Install grab bars by the toilet and in the tub and shower. Do not use towel bars as grab bars. Use non-skid mats or decals in the tub or shower. If you need to sit down in the shower, use a plastic, non-slip stool. Keep the floor dry. Clean up any water that spills on the floor as soon as it happens. Remove soap buildup in the tub or shower  regularly. Attach bath mats securely with double-sided non-slip rug tape. Do not have throw rugs and other things on the floor that can make you trip. What can I do in the bedroom? Use night lights. Make sure that you have a light by your bed that is easy to reach. Do not use any sheets or blankets that are too big for your bed. They should not hang down onto the floor. Have a firm chair that has side arms. You can use this for support while you get dressed. Do not have throw rugs and other things on the floor that can make you trip. What can I do in the kitchen? Clean up any spills right away. Avoid walking on wet floors. Keep items that you use a lot in easy-to-reach places. If you need to reach something above you, use a strong step stool that has a grab bar. Keep electrical cords out of the way. Do not use floor polish or wax that makes floors slippery. If you must use wax, use non-skid floor wax. Do not have throw rugs and other things on the floor that can make you trip. What can I do with my stairs? Do not leave any items on the stairs. Make sure that there are handrails on both sides of the stairs and use them. Fix handrails that are broken or loose. Make sure that handrails are as long as the stairways. Check any carpeting to make sure that it  is firmly attached to the stairs. Fix any carpet that is loose or worn. Avoid having throw rugs at the top or bottom of the stairs. If you do have throw rugs, attach them to the floor with carpet tape. Make sure that you have a light switch at the top of the stairs and the bottom of the stairs. If you do not have them, ask someone to add them for you. What else can I do to help prevent falls? Wear shoes that: Do not have high heels. Have rubber bottoms. Are comfortable and fit you well. Are closed at the toe. Do not wear sandals. If you use a stepladder: Make sure that it is fully opened. Do not climb a closed stepladder. Make sure that  both sides of the stepladder are locked into place. Ask someone to hold it for you, if possible. Clearly mark and make sure that you can see: Any grab bars or handrails. First and last steps. Where the edge of each step is. Use tools that help you move around (mobility aids) if they are needed. These include: Canes. Walkers. Scooters. Crutches. Turn on the lights when you go into a dark area. Replace any light bulbs as soon as they burn out. Set up your furniture so you have a clear path. Avoid moving your furniture around. If any of your floors are uneven, fix them. If there are any pets around you, be aware of where they are. Review your medicines with your doctor. Some medicines can make you feel dizzy. This can increase your chance of falling. Ask your doctor what other things that you can do to help prevent falls. This information is not intended to replace advice given to you by your health care provider. Make sure you discuss any questions you have with your health care provider. Document Released: 12/03/2008 Document Revised: 07/15/2015 Document Reviewed: 03/13/2014 Elsevier Interactive Patient Education  2017 [employer].

## 2021-08-08 ENCOUNTER — Ambulatory Visit (INDEPENDENT_AMBULATORY_CARE_PROVIDER_SITE_OTHER): Payer: Medicare PPO | Admitting: Medical

## 2021-08-08 ENCOUNTER — Encounter: Payer: Self-pay | Admitting: Medical

## 2021-08-08 VITALS — BP 120/78 | HR 61 | Temp 98.6°F | Ht 68.0 in | Wt 154.4 lb

## 2021-08-08 DIAGNOSIS — Z780 Asymptomatic menopausal state: Secondary | ICD-10-CM

## 2021-08-08 DIAGNOSIS — E559 Vitamin D deficiency, unspecified: Secondary | ICD-10-CM

## 2021-08-08 DIAGNOSIS — Z860100 Personal history of colon polyps, unspecified: Secondary | ICD-10-CM

## 2021-08-08 DIAGNOSIS — I10 Essential (primary) hypertension: Secondary | ICD-10-CM | POA: Diagnosis not present

## 2021-08-08 DIAGNOSIS — Z7185 Encounter for immunization safety counseling: Secondary | ICD-10-CM | POA: Diagnosis not present

## 2021-08-08 DIAGNOSIS — Z7189 Other specified counseling: Secondary | ICD-10-CM

## 2021-08-08 DIAGNOSIS — G8929 Other chronic pain: Secondary | ICD-10-CM

## 2021-08-08 DIAGNOSIS — E785 Hyperlipidemia, unspecified: Secondary | ICD-10-CM

## 2021-08-08 DIAGNOSIS — Z0000 Encounter for general adult medical examination without abnormal findings: Secondary | ICD-10-CM | POA: Diagnosis not present

## 2021-08-08 DIAGNOSIS — M545 Low back pain, unspecified: Secondary | ICD-10-CM

## 2021-08-08 NOTE — Progress Notes (Signed)
Subjective:   HPI  Sherri Rangel is a 66 y.o. female who presents for Chief Complaint  Patient presents with   Annual Exam    CPE already had [place] on 07/29/21 no other issues     Patient Care Team: Naseem Varden, Leward Quan as PCP - General (Family Medicine) Sees dentist Sees eye doctor Dr. Princess Bruins, gyn Dr. Richmond Campbell, GI   Concerns: Doing well, active, playing golf and pickelball.  She notes about 6 weeks ago she was coming down some stairs, and fell on her buttocks hitting her back against stair tread.  Was on the last 1-2 stairs . Had some initial pain, and pain in low back a few days but that has resolved. Has been back to golf since without pain.    Reviewed their medical, surgical, family, social, medication, and allergy history and updated chart as appropriate.  Past Medical History:  Diagnosis Date   Anemia    prior to 2016   BCC (basal cell carcinoma)    right eye, LLE   Colon polyp 08/2009   WNL, internal hemorrhoid (Dr. Earlean Shawl); also had colonoscopy 2008 with Dr. Carlean Purl   Encounter for routine gynecological examination    sees Dr. Uvaldo Rising   H/O bone density study    normal 2017, 2015   Hyperlipidemia    Hypertension    Iron deficiency 06/2009   negative EGD and colonoscopy 08/2009   Menopausal state    Squamous cell carcinoma, leg, right 2021   Vaginal atrophy     Past Surgical History:  Procedure Laterality Date   BREAST SURGERY     RIGHT BREAST BIOPSY/FCD   COLONOSCOPY  09/2014   Dr. Earlean Shawl, prior 2011.   due back 2021, hx/o polyps   skin tags      Family History  Problem Relation Age of Onset   Cancer Mother 48       ovarian cancer   Hypertension Mother    Osteoporosis Mother    Cancer Father        esophageal   Alcohol abuse Father    Hyperlipidemia Father    Cancer Brother        MULTIPLE MYELOMA   Diabetes Maternal Aunt    Diabetes Cousin    Diabetes Cousin    Heart disease Paternal Grandfather      Current  Outpatient Medications:    Cholecalciferol (VITAMIN D) 50 MCG (2000 UT) CAPS, , Disp: , Rfl:    Coenzyme Q10 (CO Q 10 PO), Take by mouth., Disp: , Rfl:    cycloSPORINE (RESTASIS) 0.05 % ophthalmic emulsion, , Disp: , Rfl:    Glucosamine-Chondroit-Vit C-Mn (GLUCOSAMINE 1500 COMPLEX PO), , Disp: , Rfl:    lisinopril (ZESTRIL) 2.5 MG tablet, Take 1 tablet (2.5 mg total) by mouth daily., Disp: 90 tablet, Rfl: 3   Multiple Vitamin (MULTIVITAMIN) tablet, Take 1 tablet by mouth daily., Disp: , Rfl:    RA KRILL OIL PO, Take 750 mg by mouth daily., Disp: , Rfl:    rosuvastatin (CRESTOR) 5 MG tablet, Take 1 tablet (5 mg total) by mouth at bedtime., Disp: 90 tablet, Rfl: 3   Turmeric 500 MG CAPS, Take by mouth., Disp: , Rfl:   No Known Allergies   Review of Systems Constitutional: -fever, -chills, -sweats, -unexpected weight change, -decreased appetite, -fatigue Allergy: -sneezing, -itching, -congestion Dermatology: -changing moles, --rash, -lumps ENT: -runny nose, -ear pain, -sore throat, -hoarseness, -sinus pain, -teeth pain, - ringing in ears, -hearing loss, -  nosebleeds Cardiology: -chest pain, -palpitations, -swelling, -difficulty breathing when lying flat, -waking up short of breath Respiratory: -cough, -shortness of breath, -difficulty breathing with exercise or exertion, -wheezing, -coughing up blood Gastroenterology: -abdominal pain, -nausea, -vomiting, -diarrhea, -constipation, -blood in stool, -changes in bowel movement, -difficulty swallowing or eating Hematology: -bleeding, -bruising  Musculoskeletal: -joint aches, -muscle aches, -joint swelling, +back pain, -neck pain, -cramping, -changes in gait Ophthalmology: denies vision changes, eye redness, itching, discharge Urology: -burning with urination, -difficulty urinating, -blood in urine, -urinary frequency, -urgency, -incontinence Neurology: -headache, -weakness, -tingling, -numbness, -memory loss, -falls, -dizziness Psychology:  -depressed mood, -agitation, -sleep problems Female GU: no testicular mass, pain, no lymph nodes swollen, no swelling, no rash.     07/29/2021    8:16 AM 01/24/2021    9:26 AM 07/01/2020   12:18 PM 01/09/2019   11:55 AM  Depression screen PHQ 2/9  Decreased Interest 0 0 0 0  Down, Depressed, Hopeless 0 0 0 0  PHQ - 2 Score 0 0 0 0        Objective:  BP 120/78   Pulse 61   Temp 98.6 F (37 C)   Ht $R'5\' 8"'fT$  (1.727 m)   Wt 154 lb 6.4 oz (70 kg)   BMI 23.48 kg/m   General appearance: alert, no distress, WD/WN, Caucasian female Skin: scattered macules, no worrisome lesions HEENT: normocephalic, conjunctiva/corneas normal, sclerae anicteric, PERRLA, EOMi, nares patent, no discharge or erythema, pharynx normal Oral cavity: MMM, tongue normal, teeth in good repair Neck: supple, no lymphadenopathy, no thyromegaly, no masses, normal ROM, no bruits Chest: non tender, normal shape and expansion Heart: RRR, normal S1, S2, no murmurs Lungs: CTA bilaterally, no wheezes, rhonchi, or rales Abdomen: +bs, soft, non tender, non distended, no masses, no hepatomegaly, no splenomegaly, no bruits Back: non tender, normal ROM, no scoliosis Musculoskeletal: upper extremities non tender, no obvious deformity, normal ROM throughout, lower extremities non tender, no obvious deformity, normal ROM throughout Extremities: no edema, no cyanosis, no clubbing Pulses: 2+ symmetric, upper and lower extremities, normal cap refill Neurological: alert, oriented x 3, CN2-12 intact, strength normal upper extremities and lower extremities, sensation normal throughout, DTRs 2+ throughout, no cerebellar signs, gait normal Psychiatric: normal affect, behavior normal, pleasant  Breast/gyn/rectal - deferred to gyn   Assessment and Plan :   Encounter Diagnoses  Name Primary?   Encounter for health maintenance examination in adult Yes   Essential hypertension, benign    Vaccine counseling    History of colon polyps     Hyperlipidemia, unspecified hyperlipidemia type    Chronic bilateral low back pain without sciatica    Advanced care planning/counseling discussion    Post-menopausal    Vitamin D deficiency     This visit was a preventative care visit, also known as wellness visit or routine physical.   Topics typically include healthy lifestyle, diet, exercise, preventative care, vaccinations, sick and well care, proper use of emergency dept and after hours care, as well as other concerns.     Recommendations: Continue to return yearly for your annual wellness and preventative care visits.  This gives [place] a chance to discuss healthy lifestyle, exercise, vaccinations, review your chart record, and perform screenings where appropriate.  I recommend you see your eye doctor yearly for routine vision care.  I recommend you see your dentist yearly for routine dental care including hygiene visits twice yearly.  See your gynecologist yearly for routine gynecological care.   Vaccination recommendations were reviewed Immunization History  Administered  Date(s) Administered   Fluad Quad(high Dose 65+) 12/05/2018, 12/15/2020   Influenza Split 10/31/2011   Influenza,inj,Quad PF,6+ Mos 11/06/2013, 10/27/2015, 11/13/2016, 11/23/2017   Influenza-Unspecified 11/28/2019   PFIZER(Purple Top)SARS-COV-2 Vaccination 03/28/2019, 04/22/2019, 12/01/2019, 09/01/2020   Pfizer Covid-19 Vaccine Bivalent Booster 5y-11y 02/01/2021   Pneumococcal Conjugate-13 01/09/2019   Pneumococcal Polysaccharide-23 04/19/2020   Tdap 03/01/2007   Zoster Recombinat (Shingrix) 04/18/2021, 07/04/2021   Zoster, Live 10/24/2012   She will go back to pharmacy soon for Tetanus booster.   Screening for cancer: Colon cancer screening: I reviewed your colonoscopy on file that is up to date from 02/2020, due repeat in 5 years  Mammogram and pap reviewed, up to date  Skin cancer screening: Check your skin regularly for new changes, growing  lesions, or other lesions of concern Come in for evaluation if you have skin lesions of concern.  Lung cancer screening: If you have a greater than 20 pack year history of tobacco use, then you may qualify for lung cancer screening with a chest CT scan.   Please call your insurance company to inquire about coverage for this test.  We currently don't have screenings for other cancers besides breast, cervical, colon, and lung cancers.  If you have a strong family history of cancer or have other cancer screening concerns, please let me know.    Bone health: Get at least 150 minutes of aerobic exercise weekly Get weight bearing exercise at least once weekly Bone density test:  A bone density test is an imaging test that uses a type of X-ray to measure the amount of calcium and other minerals in your bones. The test may be used to diagnose or screen you for a condition that causes weak or thin bones (osteoporosis), predict your risk for a broken bone (fracture), or determine how well your osteoporosis treatment is working. The bone density test is recommended for females 76 and older, or females or males <30 if certain risk factors such as thyroid disease, long term use of steroids such as for asthma or rheumatological issues, vitamin D deficiency, estrogen deficiency, family history of osteoporosis, self or family history of fragility fracture in first degree relative.  12/2020 bone density shows osteopenia  Heart health: Get at least 150 minutes of aerobic exercise weekly Limit alcohol It is important to maintain a healthy blood pressure and healthy cholesterol numbers  Heart disease screening: Screening for heart disease includes screening for blood pressure, fasting lipids, glucose/diabetes screening, BMI height to weight ratio, reviewed of smoking status, physical activity, and diet.    Goals include blood pressure 120/80 or less, maintaining a healthy lipid/cholesterol profile, preventing  diabetes or keeping diabetes numbers under good control, not smoking or using tobacco products, exercising most days per week or at least 150 minutes per week of exercise, and eating healthy variety of fruits and vegetables, healthy oils, and avoiding unhealthy food choices like fried food, fast food, high sugar and high cholesterol foods.    Other tests may possibly include EKG test, CT coronary calcium score, echocardiogram, exercise treadmill stress test.   Consider CT coronary test   Medical care options: I recommend you continue to seek care here first for routine care.  We try really hard to have available appointments Monday through Friday daytime hours for sick visits, acute visits, and physicals.  Urgent care should be used for after hours and weekends for significant issues that cannot wait till the next day.  The emergency department should be used for significant  potentially life-threatening emergencies.  The emergency department is expensive, can often have long wait times for less significant concerns, so try to utilize primary care, urgent care, or telemedicine when possible to avoid unnecessary trips to the emergency department.  Virtual visits and telemedicine have been introduced since the pandemic started in 2020, and can be convenient ways to receive medical care.  We offer virtual appointments as well to assist you in a variety of options to seek medical care.   Advanced Directives: I recommend you consider completing a [place] and Living Will.   These documents respect your wishes and help alleviate burdens on your loved ones if you were to become terminally ill or be in a position to need those documents enforced.    You can complete Advanced Directives yourself, have them notarized, then have copies made for our office, for you and for anybody you feel should have them in safe keeping.  Or, you can have an attorney prepare these documents.   If you  haven't updated your Last Will and Testament in a while, it may be worthwhile having an attorney prepare these documents together and save on some costs.       Separate significant issues discussed: Hyperlipoidema - continue current medication, labs today  Hypertension - controlled on low dose medication, lisinopril 2.5mg  daily  C/t vit D supplement  Osteopenia - continue ca+D supplement, weight bearing and aerobic exercise   Linzie was seen today for annual exam.  Diagnoses and all orders for this visit:  Encounter for health maintenance examination in adult -     Comprehensive metabolic panel -     CBC -     VITAMIN D 25 Hydroxy (Vit-D Deficiency, Fractures) -     Lipid panel -     Urinalysis  Essential hypertension, benign  Vaccine counseling  History of colon polyps  Hyperlipidemia, unspecified hyperlipidemia type -     Lipid panel  Chronic bilateral low back pain without sciatica  Advanced care planning/counseling discussion  Post-menopausal  Vitamin D deficiency -     VITAMIN D 25 Hydroxy (Vit-D Deficiency, Fractures)    Follow-up pending labs, yearly for physical

## 2021-08-09 ENCOUNTER — Encounter: Payer: Self-pay | Admitting: *Deleted

## 2021-08-09 LAB — URINALYSIS
Bilirubin, UA: NEGATIVE
Glucose, UA: NEGATIVE
Ketones, UA: NEGATIVE
Leukocytes,UA: NEGATIVE
Nitrite, UA: NEGATIVE
Protein,UA: NEGATIVE
RBC, UA: NEGATIVE
Specific Gravity, UA: 1.009 (ref 1.005–1.030)
Urobilinogen, Ur: 0.2 mg/dL (ref 0.2–1.0)
pH, UA: 6 (ref 5.0–7.5)

## 2021-08-09 LAB — COMPREHENSIVE METABOLIC PANEL
ALT: 16 IU/L (ref 0–32)
AST: 21 IU/L (ref 0–40)
Albumin/Globulin Ratio: 1.9 (ref 1.2–2.2)
Albumin: 4.6 g/dL (ref 3.8–4.8)
Alkaline Phosphatase: 82 IU/L (ref 44–121)
BUN/Creatinine Ratio: 17 (ref 12–28)
BUN: 14 mg/dL (ref 8–27)
Bilirubin Total: 0.4 mg/dL (ref 0.0–1.2)
CO2: 24 mmol/L (ref 20–29)
Calcium: 9.3 mg/dL (ref 8.7–10.3)
Chloride: 103 mmol/L (ref 96–106)
Creatinine, Ser: 0.84 mg/dL (ref 0.57–1.00)
Globulin, Total: 2.4 g/dL (ref 1.5–4.5)
Glucose: 82 mg/dL (ref 70–99)
Potassium: 4.5 mmol/L (ref 3.5–5.2)
Sodium: 141 mmol/L (ref 134–144)
Total Protein: 7 g/dL (ref 6.0–8.5)
eGFR: 76 mL/min/{1.73_m2} (ref >59)

## 2021-08-09 LAB — LIPID PANEL
Chol/HDL Ratio: 2.3 ratio (ref 0.0–4.4)
Cholesterol, Total: 147 mg/dL (ref 100–199)
HDL: 64 mg/dL (ref >39)
LDL Chol Calc (NIH): 68 mg/dL (ref 0–99)
Triglycerides: 79 mg/dL (ref 0–149)
VLDL Cholesterol Cal: 15 mg/dL (ref 5–40)

## 2021-08-09 LAB — CBC
Hematocrit: 40 % (ref 34.0–46.6)
Hemoglobin: 13.3 g/dL (ref 11.1–15.9)
MCH: 29.6 pg (ref 26.6–33.0)
MCHC: 33.3 g/dL (ref 31.5–35.7)
MCV: 89 fL (ref 79–97)
Platelets: 245 10*3/uL (ref 150–450)
RBC: 4.49 x10E6/uL (ref 3.77–5.28)
RDW: 12.5 % (ref 11.7–15.4)
WBC: 4.9 10*3/uL (ref 3.4–10.8)

## 2021-08-09 LAB — VITAMIN D 25 HYDROXY (VIT D DEFICIENCY, FRACTURES): Vit D, 25-Hydroxy: 74.2 ng/mL (ref 30.0–100.0)

## 2021-10-26 ENCOUNTER — Encounter: Payer: Self-pay | Admitting: Internal Medicine

## 2021-11-08 DIAGNOSIS — L570 Actinic keratosis: Secondary | ICD-10-CM | POA: Diagnosis not present

## 2021-11-08 DIAGNOSIS — Z1283 Encounter for screening for malignant neoplasm of skin: Secondary | ICD-10-CM | POA: Diagnosis not present

## 2021-11-08 DIAGNOSIS — D225 Melanocytic nevi of trunk: Secondary | ICD-10-CM | POA: Diagnosis not present

## 2021-11-08 DIAGNOSIS — X32XXXD Exposure to sunlight, subsequent encounter: Secondary | ICD-10-CM | POA: Diagnosis not present

## 2021-11-21 DIAGNOSIS — Z1231 Encounter for screening mammogram for malignant neoplasm of breast: Secondary | ICD-10-CM | POA: Diagnosis not present

## 2021-11-21 LAB — HM MAMMOGRAPHY

## 2021-11-23 ENCOUNTER — Encounter: Payer: Self-pay | Admitting: Obstetrics & Gynecology

## 2021-11-29 ENCOUNTER — Encounter: Payer: Self-pay | Admitting: Internal Medicine

## 2021-12-16 ENCOUNTER — Ambulatory Visit (INDEPENDENT_AMBULATORY_CARE_PROVIDER_SITE_OTHER): Payer: Medicare PPO | Admitting: Obstetrics & Gynecology

## 2021-12-16 ENCOUNTER — Encounter: Payer: Self-pay | Admitting: Obstetrics & Gynecology

## 2021-12-16 VITALS — BP 120/82 | HR 67 | Ht 67.0 in | Wt 152.0 lb

## 2021-12-16 DIAGNOSIS — Z780 Asymptomatic menopausal state: Secondary | ICD-10-CM

## 2021-12-16 DIAGNOSIS — Z01419 Encounter for gynecological examination (general) (routine) without abnormal findings: Secondary | ICD-10-CM

## 2021-12-16 DIAGNOSIS — M85851 Other specified disorders of bone density and structure, right thigh: Secondary | ICD-10-CM

## 2021-12-16 DIAGNOSIS — Z9189 Other specified personal risk factors, not elsewhere classified: Secondary | ICD-10-CM

## 2021-12-16 DIAGNOSIS — Z8041 Family history of malignant neoplasm of ovary: Secondary | ICD-10-CM

## 2021-12-16 NOTE — Progress Notes (Signed)
Sherri Rangel 1955-07-08 [id number]   History:    66 y.o.  G3P3L3 Married.  Retired but Civil engineer, contracting as a Art therapist.   RP:  Established patient presenting for annual gyn exam    HPI: Postmenopausal, well on no hormone replacement therapy.  No postmenopausal bleeding.  No pelvic pain.  No pain with intercourse.  Pap Neg 11/2019.  No h/o abnormal Pap.  Will repeat Pap at 3 years. Pelvic US 01/25/21 Bilateral Ovaries normal with atrophy.  Urine and bowel movements normal. Breasts normal.  Screening mammogram Neg 11/2021.  Body mass index 23.81.  Regular fitness activities. Health labs with family physician.  BD Osteopenia T-Score -1.4 in 12/2020. [place] 02/2020.   Past medical history,surgical history, family history and social history were all reviewed and documented in the EPIC chart.  Gynecologic History No LMP recorded. Patient is postmenopausal.  Obstetric History OB History  Gravida Para Term Preterm AB Living  '3 3 3     3  '$ SAB IAB Ectopic Multiple Live Births               # Outcome Date GA Lbr Len/2nd Weight Sex Delivery Anes PTL Lv  3 Term           2 Term           1 Term              ROS: A ROS was performed and pertinent positives and negatives are included in the history. GENERAL: No fevers or chills. HEENT: No change in vision, no earache, sore throat or sinus congestion. NECK: No pain or stiffness. CARDIOVASCULAR: No chest pain or pressure. No palpitations. PULMONARY: No shortness of breath, cough or wheeze. GASTROINTESTINAL: No abdominal pain, nausea, vomiting or diarrhea, melena or bright red blood per rectum. GENITOURINARY: No urinary frequency, urgency, hesitancy or dysuria. MUSCULOSKELETAL: No joint or muscle pain, no back pain, no recent trauma. DERMATOLOGIC: No rash, no itching, no lesions. ENDOCRINE: No polyuria, polydipsia, no heat or cold intolerance. No recent change in weight. HEMATOLOGICAL: No anemia or easy bruising or bleeding. NEUROLOGIC: No headache,  seizures, numbness, tingling or weakness. PSYCHIATRIC: No depression, no loss of interest in normal activity or change in sleep pattern.     Exam:   BP 120/82   Pulse 67   Ht '5\' 7"'$  (1.702 m)   Wt 152 lb (68.9 kg)   SpO2 97%   BMI 23.81 kg/m   Body mass index is 23.81 kg/m.  General appearance : Well developed well nourished female. No acute distress HEENT: Eyes: no retinal hemorrhage or exudates,  Neck supple, trachea midline, no carotid bruits, no thyroidmegaly Lungs: Clear to auscultation, no rhonchi or wheezes, or rib retractions  Heart: Regular rate and rhythm, no murmurs or gallops Breast:Examined in sitting and supine position were symmetrical in appearance, no palpable masses or tenderness,  no skin retraction, no nipple inversion, no nipple discharge, no skin discoloration, no axillary or supraclavicular lymphadenopathy Abdomen: no palpable masses or tenderness, no rebound or guarding Extremities: no edema or skin discoloration or tenderness  Pelvic: Vulva: Normal             Vagina: No gross lesions or discharge  Cervix: No gross lesions or discharge  Uterus  AV, normal size, shape and consistency, non-tender and mobile  Adnexa  Without masses or tenderness  Anus: Normal   Assessment/Plan:  66 y.o. female for annual exam   1. Wellness gyn exam with High risk  of ovarian cancer Postmenopausal, well on no hormone replacement therapy.  No postmenopausal bleeding.  No pelvic pain. No pain with intercourse.  Pap Neg 11/2019.  No h/o abnormal Pap.  Will repeat Pap at 3 years. Pelvic US 01/25/21 Bilateral Ovaries normal with atrophy.  Urine and bowel movements normal. Breasts normal. Screening mammogram Neg 11/2021.  Body mass index 23.81.  Regular fitness activities. Health labs with family physician.  BD Osteopenia T-Score -1.4 in 12/2020. [place] 02/2020.  2. Postmenopausal Postmenopausal, well on no hormone replacement therapy.  No postmenopausal bleeding.  No pelvic pain. No  pain with intercourse.   3. Osteopenia of neck of right femur BD Osteopenia T-Score -1.4 in 12/2020.  Body mass index 23.81.  Regular fitness activities.  Vit D, Vit K2 100 microgram daily, Ca++ total 1.5 g/d.  Repeat BD in 12/2022.  4. Family history of ovarian cancer Pelvic US Neg 01/2021.  Will repeat at 2 years.  Other orders - Calcium-Magnesium-Vitamin D (CALCIUM 1200+D3 PO)   Princess Bruins MD, 9:42 AM 12/16/2021

## 2022-01-10 DIAGNOSIS — X32XXXD Exposure to sunlight, subsequent encounter: Secondary | ICD-10-CM | POA: Diagnosis not present

## 2022-01-10 DIAGNOSIS — C44629 Squamous cell carcinoma of skin of left upper limb, including shoulder: Secondary | ICD-10-CM | POA: Diagnosis not present

## 2022-01-10 DIAGNOSIS — C44319 Basal cell carcinoma of skin of other parts of face: Secondary | ICD-10-CM | POA: Diagnosis not present

## 2022-01-10 DIAGNOSIS — L570 Actinic keratosis: Secondary | ICD-10-CM | POA: Diagnosis not present

## 2022-01-16 ENCOUNTER — Other Ambulatory Visit: Payer: Self-pay | Admitting: Medical

## 2022-01-31 DIAGNOSIS — H524 Presbyopia: Secondary | ICD-10-CM | POA: Diagnosis not present

## 2022-01-31 DIAGNOSIS — H5203 Hypermetropia, bilateral: Secondary | ICD-10-CM | POA: Diagnosis not present

## 2022-01-31 DIAGNOSIS — H52221 Regular astigmatism, right eye: Secondary | ICD-10-CM | POA: Diagnosis not present

## 2022-02-28 ENCOUNTER — Encounter: Payer: Self-pay | Admitting: Internal Medicine

## 2022-02-28 DIAGNOSIS — C44319 Basal cell carcinoma of skin of other parts of face: Secondary | ICD-10-CM | POA: Diagnosis not present

## 2022-02-28 DIAGNOSIS — Z85828 Personal history of other malignant neoplasm of skin: Secondary | ICD-10-CM | POA: Diagnosis not present

## 2022-02-28 DIAGNOSIS — Z08 Encounter for follow-up examination after completed treatment for malignant neoplasm: Secondary | ICD-10-CM | POA: Diagnosis not present

## 2022-04-05 DIAGNOSIS — M4126 Other idiopathic scoliosis, lumbar region: Secondary | ICD-10-CM | POA: Diagnosis not present

## 2022-04-05 DIAGNOSIS — M4150 Other secondary scoliosis, site unspecified: Secondary | ICD-10-CM | POA: Diagnosis not present

## 2022-05-17 DIAGNOSIS — C44319 Basal cell carcinoma of skin of other parts of face: Secondary | ICD-10-CM | POA: Diagnosis not present

## 2022-05-17 DIAGNOSIS — L570 Actinic keratosis: Secondary | ICD-10-CM | POA: Diagnosis not present

## 2022-05-17 DIAGNOSIS — Z1283 Encounter for screening for malignant neoplasm of skin: Secondary | ICD-10-CM | POA: Diagnosis not present

## 2022-05-17 DIAGNOSIS — D225 Melanocytic nevi of trunk: Secondary | ICD-10-CM | POA: Diagnosis not present

## 2022-05-17 DIAGNOSIS — X32XXXD Exposure to sunlight, subsequent encounter: Secondary | ICD-10-CM | POA: Diagnosis not present

## 2022-05-25 IMAGING — CR DG LUMBAR SPINE COMPLETE 4+V
5 series · 5 of 5 positions shown · non-contrast
Comparison: None.

CLINICAL DATA: Chronic back pain without known injury.

EXAM:
LUMBAR SPINE - COMPLETE 4+ VIEW

[w lumbar spine ap]
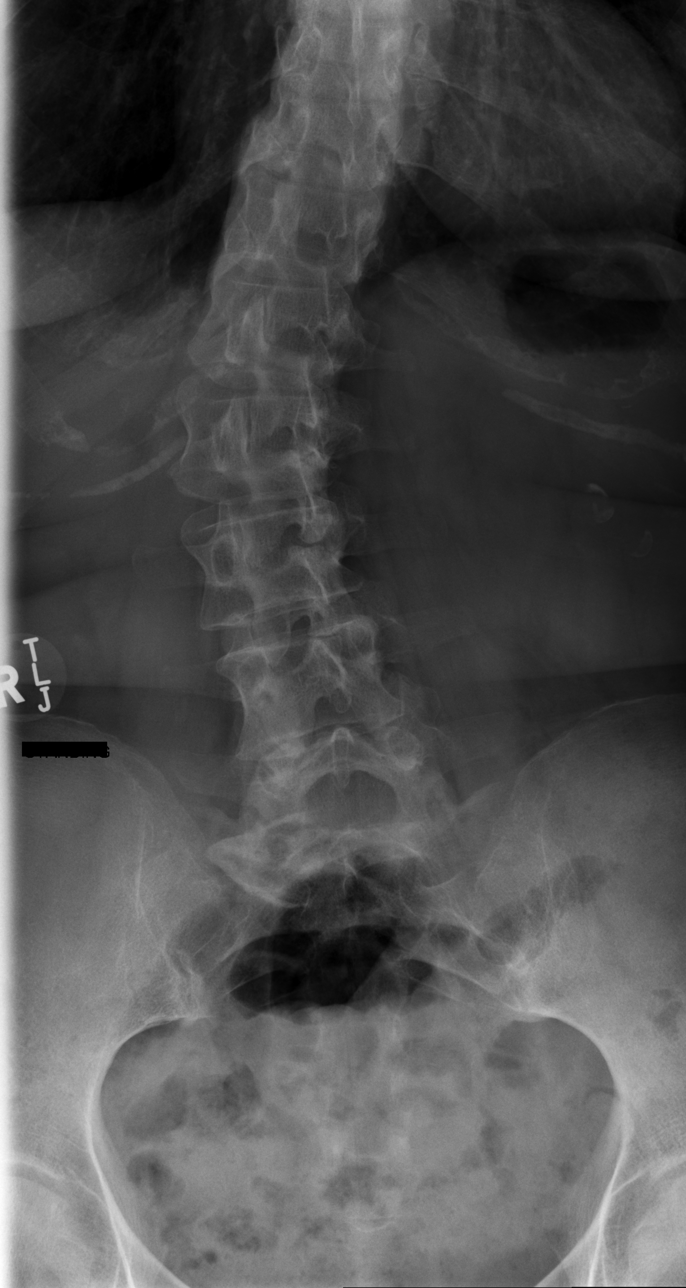

[w lumbar spine obl (1 of 2)]
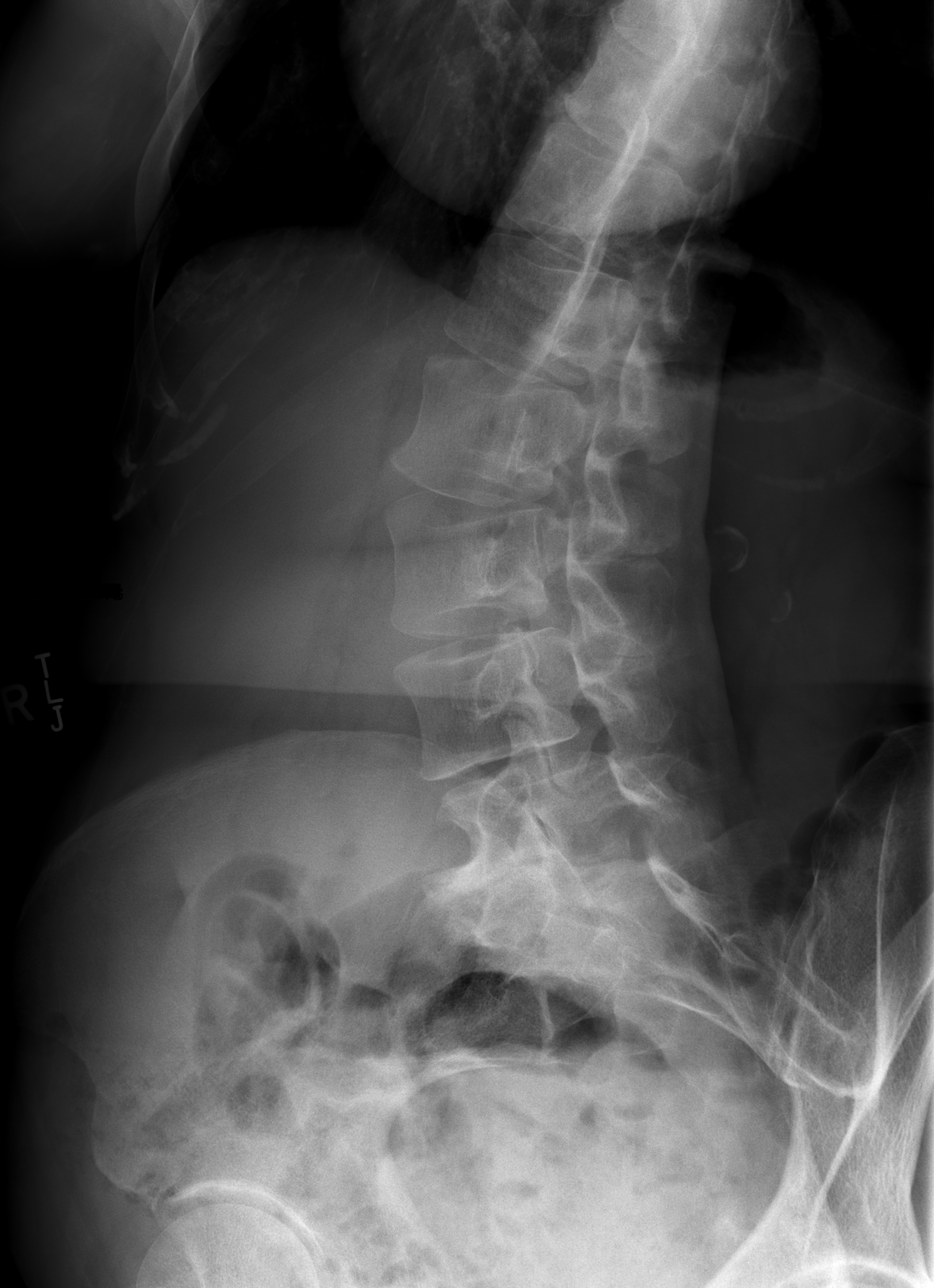

[w lumbar spine obl (2 of 2)]
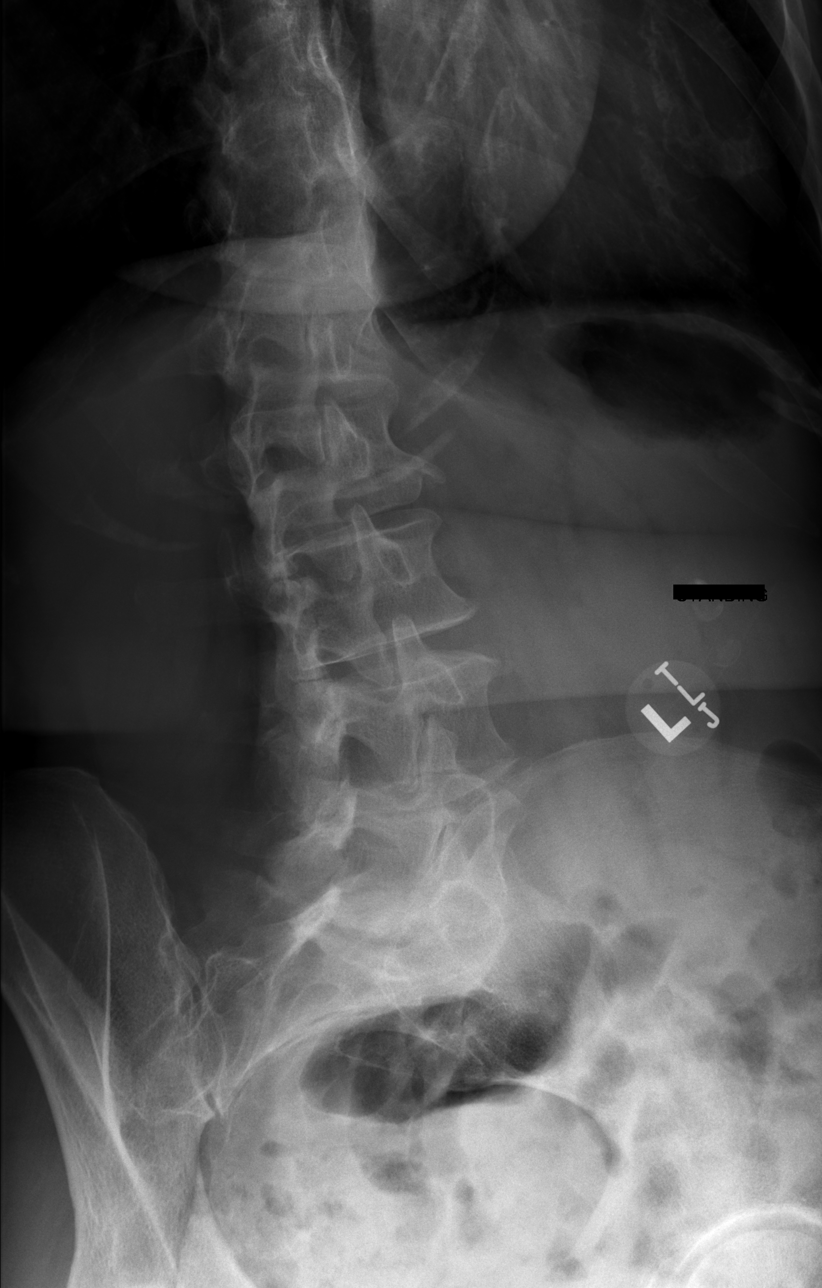

[w lumbar spine lat]
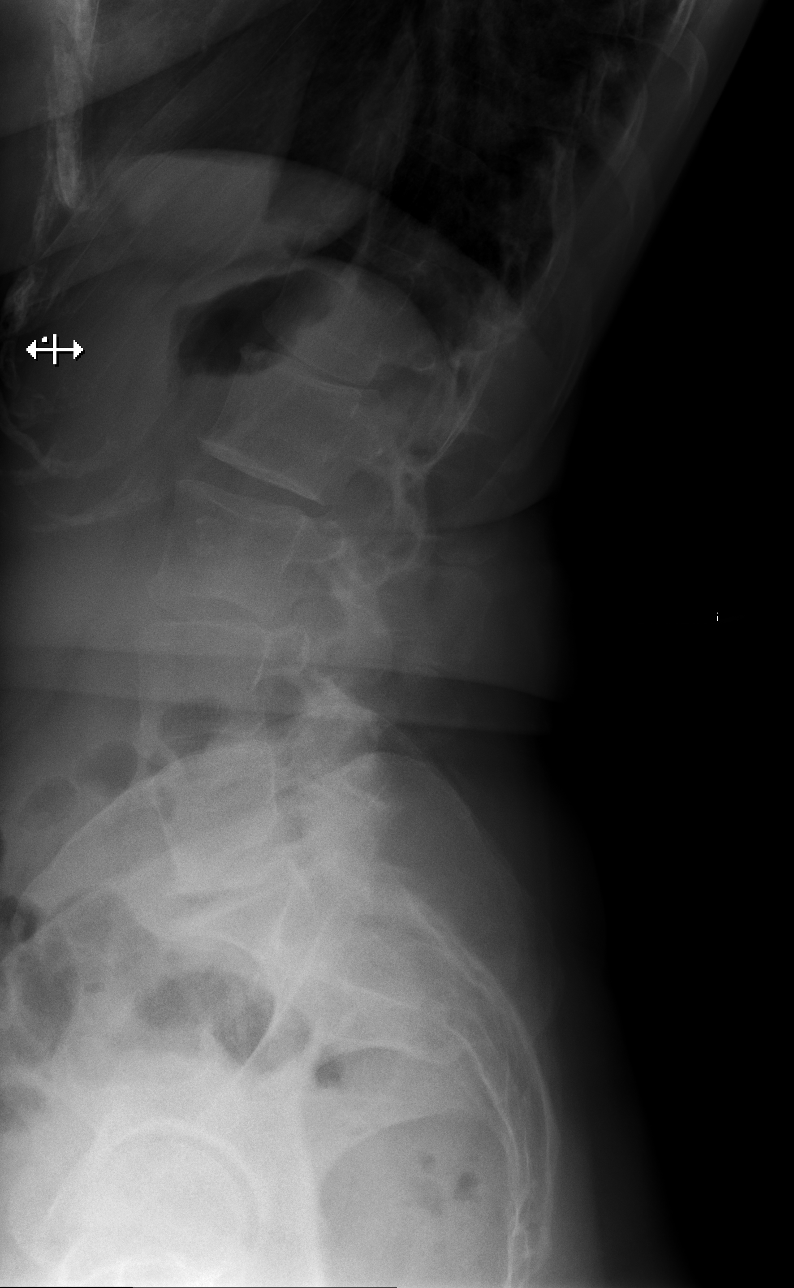

[w lumbar l-5 s-1 spot]
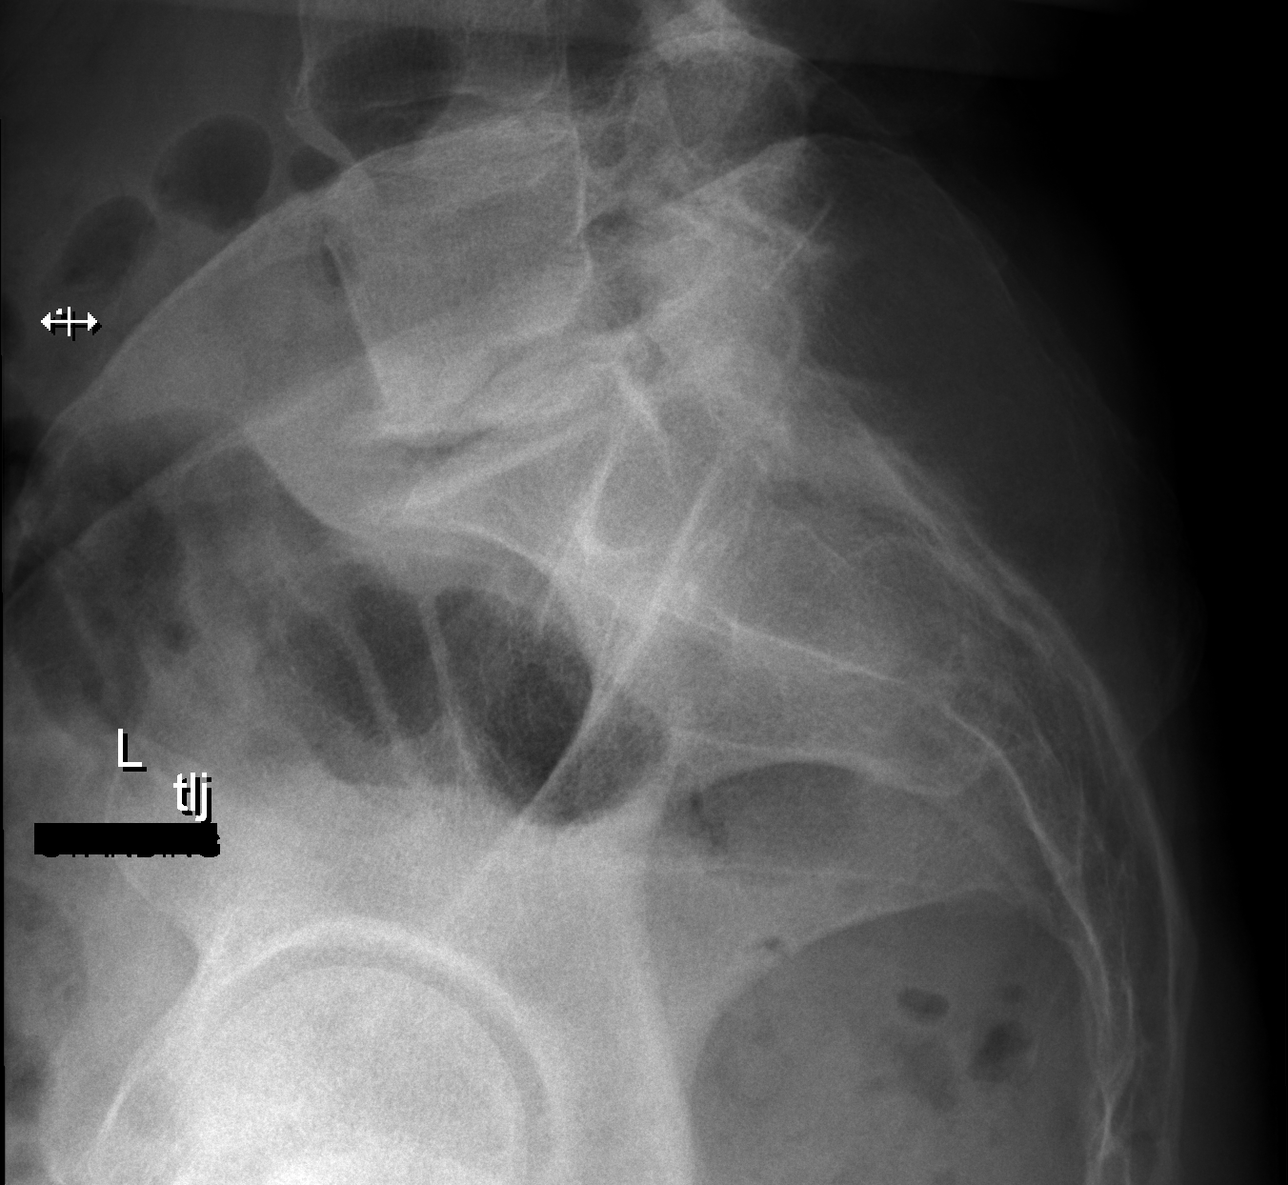

[5 of 5 positions shown; findings below may reference images not displayed]

FINDINGS: Moderate dextroscoliosis is seen involving the lower thoracic and
lumbar spine. No fracture or spondylolisthesis is noted. Moderate
degenerative disc disease is noted at L3-4, L4-5 and L5-S1.
IMPRESSION: Moderate multilevel degenerative disc disease. No acute abnormality
seen in the lumbar spine.

## 2022-07-14 ENCOUNTER — Other Ambulatory Visit: Payer: Self-pay | Admitting: Medical

## 2022-08-08 ENCOUNTER — Ambulatory Visit (INDEPENDENT_AMBULATORY_CARE_PROVIDER_SITE_OTHER): Payer: Medicare PPO

## 2022-08-08 VITALS — Ht 68.0 in | Wt 151.0 lb

## 2022-08-08 DIAGNOSIS — Z0000 Encounter for general adult medical examination without abnormal findings: Secondary | ICD-10-CM

## 2022-08-08 NOTE — Patient Instructions (Signed)
Sherri Rangel , Thank you for taking time to come for your Medicare Wellness Visit. I appreciate your ongoing commitment to your health goals. Please review the following plan we discussed and let me know if I can assist you in the future.   These are the goals we discussed:  Goals      Patient Stated     07/29/2021, maintain and try not to gain weight     Patient Stated     08/08/2022, maintain current health        This is a list of the screening recommended for you and due dates:  Health Maintenance  Topic Date Due   Flu Shot  09/21/2022   Medicare Annual Wellness Visit  08/08/2023   Mammogram  11/24/2023   Colon Cancer Screening  02/23/2030   DTaP/Tdap/Td vaccine (3 - Td or Tdap) 08/09/2031   Pneumonia Vaccine  Completed   DEXA scan (bone density measurement)  Completed   COVID-19 Vaccine  Completed   Hepatitis C Screening  Completed   Zoster (Shingles) Vaccine  Completed   HPV Vaccine  Aged Out    Advanced directives: Advance directive discussed with you today.   Conditions/risks identified: none  Next appointment: Follow up in one year for your annual wellness visit    Preventive Care 65 Years and Older, Female Preventive care refers to lifestyle choices and visits with your health care provider that can promote health and wellness. What does preventive care include? A yearly physical exam. This is also called an annual well check. Dental exams once or twice a year. Routine eye exams. Ask your health care provider how often you should have your eyes checked. Personal lifestyle choices, including: Daily care of your teeth and gums. Regular physical activity. Eating a healthy diet. Avoiding tobacco and drug use. Limiting alcohol use. Practicing safe sex. Taking low-dose aspirin every day. Taking vitamin and mineral supplements as recommended by your health care provider. What happens during an annual well check? The services and screenings done by your health care  provider during your annual well check will depend on your age, overall health, lifestyle risk factors, and family history of disease. Counseling  Your health care provider may ask you questions about your: Alcohol use. Tobacco use. Drug use. Emotional well-being. Home and relationship well-being. Sexual activity. Eating habits. History of falls. Memory and ability to understand (cognition). Work and work Astronomer. Reproductive health. Screening  You may have the following tests or measurements: Height, weight, and BMI. Blood pressure. Lipid and cholesterol levels. These may be checked every 5 years, or more frequently if you are over 51 years old. Skin check. Lung cancer screening. You may have this screening every year starting at age 67 if you have a 30-pack-year history of smoking and currently smoke or have quit within the past 15 years. Fecal occult blood test (FOBT) of the stool. You may have this test every year starting at age 41. Flexible sigmoidoscopy or colonoscopy. You may have a sigmoidoscopy every 5 years or a colonoscopy every 10 years starting at age 27. Hepatitis C blood test. Hepatitis B blood test. Sexually transmitted disease (STD) testing. Diabetes screening. This is done by checking your blood sugar (glucose) after you have not eaten for a while (fasting). You may have this done every 1-3 years. Bone density scan. This is done to screen for osteoporosis. You may have this done starting at age 46. Mammogram. This may be done every 1-2 years. Talk to your  health care provider about how often you should have regular mammograms. Talk with your health care provider about your test results, treatment options, and if necessary, the need for more tests. Vaccines  Your health care provider may recommend certain vaccines, such as: Influenza vaccine. This is recommended every year. Tetanus, diphtheria, and acellular pertussis (Tdap, Td) vaccine. You may need a Td  booster every 10 years. Zoster vaccine. You may need this after age 66. Pneumococcal 13-valent conjugate (PCV13) vaccine. One dose is recommended after age 7. Pneumococcal polysaccharide (PPSV23) vaccine. One dose is recommended after age 53. Talk to your health care provider about which screenings and vaccines you need and how often you need them. This information is not intended to replace advice given to you by your health care provider. Make sure you discuss any questions you have with your health care provider. Document Released: 03/05/2015 Document Revised: 10/27/2015 Document Reviewed: 12/08/2014 Elsevier Interactive Patient Education  2017 [employer].  Fall Prevention in the Home Falls can cause injuries. They can happen to people of all ages. There are many things you can do to make your home safe and to help prevent falls. What can I do on the outside of my home? Regularly fix the edges of walkways and driveways and fix any cracks. Remove anything that might make you trip as you walk through a door, such as a raised step or threshold. Trim any bushes or trees on the path to your home. Use bright outdoor lighting. Clear any walking paths of anything that might make someone trip, such as rocks or tools. Regularly check to see if handrails are loose or broken. Make sure that both sides of any steps have handrails. Any raised decks and porches should have guardrails on the edges. Have any leaves, snow, or ice cleared regularly. Use sand or salt on walking paths during winter. Clean up any spills in your garage right away. This includes oil or grease spills. What can I do in the bathroom? Use night lights. Install grab bars by the toilet and in the tub and shower. Do not use towel bars as grab bars. Use non-skid mats or decals in the tub or shower. If you need to sit down in the shower, use a plastic, non-slip stool. Keep the floor dry. Clean up any water that spills on the floor  as soon as it happens. Remove soap buildup in the tub or shower regularly. Attach bath mats securely with double-sided non-slip rug tape. Do not have throw rugs and other things on the floor that can make you trip. What can I do in the bedroom? Use night lights. Make sure that you have a light by your bed that is easy to reach. Do not use any sheets or blankets that are too big for your bed. They should not hang down onto the floor. Have a firm chair that has side arms. You can use this for support while you get dressed. Do not have throw rugs and other things on the floor that can make you trip. What can I do in the kitchen? Clean up any spills right away. Avoid walking on wet floors. Keep items that you use a lot in easy-to-reach places. If you need to reach something above you, use a strong step stool that has a grab bar. Keep electrical cords out of the way. Do not use floor polish or wax that makes floors slippery. If you must use wax, use non-skid floor wax. Do not have  throw rugs and other things on the floor that can make you trip. What can I do with my stairs? Do not leave any items on the stairs. Make sure that there are handrails on both sides of the stairs and use them. Fix handrails that are broken or loose. Make sure that handrails are as long as the stairways. Check any carpeting to make sure that it is firmly attached to the stairs. Fix any carpet that is loose or worn. Avoid having throw rugs at the top or bottom of the stairs. If you do have throw rugs, attach them to the floor with carpet tape. Make sure that you have a light switch at the top of the stairs and the bottom of the stairs. If you do not have them, ask someone to add them for you. What else can I do to help prevent falls? Wear shoes that: Do not have high heels. Have rubber bottoms. Are comfortable and fit you well. Are closed at the toe. Do not wear sandals. If you use a stepladder: Make sure that it is  fully opened. Do not climb a closed stepladder. Make sure that both sides of the stepladder are locked into place. Ask someone to hold it for you, if possible. Clearly mark and make sure that you can see: Any grab bars or handrails. First and last steps. Where the edge of each step is. Use tools that help you move around (mobility aids) if they are needed. These include: Canes. Walkers. Scooters. Crutches. Turn on the lights when you go into a dark area. Replace any light bulbs as soon as they burn out. Set up your furniture so you have a clear path. Avoid moving your furniture around. If any of your floors are uneven, fix them. If there are any pets around you, be aware of where they are. Review your medicines with your doctor. Some medicines can make you feel dizzy. This can increase your chance of falling. Ask your doctor what other things that you can do to help prevent falls. This information is not intended to replace advice given to you by your health care provider. Make sure you discuss any questions you have with your health care provider. Document Released: 12/03/2008 Document Revised: 07/15/2015 Document Reviewed: 03/13/2014 Elsevier Interactive Patient Education  2017 [employer].

## 2022-08-08 NOTE — Progress Notes (Signed)
Subjective:   Sherri Rangel is a 67 y.o. female who presents for Medicare Annual (Subsequent) preventive examination.  I connected with  Bertram Savin on 08/08/22 by a audio enabled telemedicine application and verified that I am speaking with the correct person using two identifiers.  Patient Location: Home  Provider Location: Office/Clinic  I discussed the limitations of evaluation and management by telemedicine. The patient expressed understanding and agreed to proceed.  Patient Medicare AWV questionnaire was completed by the patient on 08/04/2022; I have confirmed that all information answered by patient is correct and no changes since this date.  Review of Systems     Cardiac Risk Factors include: dyslipidemia;hypertension     Objective:    Today's Vitals   08/08/22 0841  Weight: 151 lb (68.5 kg)  Height: 5\' 8"  (1.727 m)   Body mass index is 22.96 kg/m.     08/08/2022    8:46 AM 07/29/2021    8:14 AM 01/09/2019   11:57 AM  Advanced Directives  Does Patient Have a Medical Advance Directive? No No No  Would patient like information on creating a medical advance directive?  No - Patient declined     Current Medications (verified) Outpatient Encounter Medications as of 08/08/2022  Medication Sig   Calcium-Magnesium-Vitamin D (CALCIUM 1200+D3 PO)    Cholecalciferol (VITAMIN D) 50 MCG (2000 UT) CAPS    Coenzyme Q10 (CO Q 10 PO) Take by mouth.   cycloSPORINE (RESTASIS) 0.05 % ophthalmic emulsion    Glucosamine-Chondroit-Vit C-Mn (GLUCOSAMINE 1500 COMPLEX PO)    lisinopril (ZESTRIL) 2.5 MG tablet TAKE 1 TABLET (2.5 MG TOTAL) BY MOUTH DAILY.   Multiple Vitamin (MULTIVITAMIN) tablet Take 1 tablet by mouth daily.   RA KRILL OIL PO Take 750 mg by mouth daily.   rosuvastatin (CRESTOR) 5 MG tablet TAKE 1 TABLET (5 MG TOTAL) BY MOUTH AT BEDTIME.   Turmeric 500 MG CAPS Take by mouth.   No facility-administered encounter medications on file as of 08/08/2022.     Allergies (verified) Patient has no known allergies.   History: Past Medical History:  Diagnosis Date   Anemia    prior to 2016   BCC (basal cell carcinoma)    right eye, LLE   Colon polyp 08/2009   WNL, internal hemorrhoid (Dr. Kinnie Scales); also had colonoscopy 2008 with Dr. Leone Payor   Encounter for routine gynecological examination    sees Dr. Reynaldo Minium   H/O bone density study    normal 2017, 2015   Hyperlipidemia    Hypertension    Iron deficiency 06/2009   negative EGD and colonoscopy 08/2009   Menopausal state    Squamous cell carcinoma, leg, right 2021   Vaginal atrophy    Past Surgical History:  Procedure Laterality Date   BREAST SURGERY     RIGHT BREAST BIOPSY/FCD   COLONOSCOPY  09/2014   Dr. Kinnie Scales, prior 2011.   due back 2021, hx/o polyps   skin tags     Family History  Problem Relation Age of Onset   Cancer Mother 40       ovarian cancer   Hypertension Mother    Osteoporosis Mother    Cancer Father        esophageal   Alcohol abuse Father    Hyperlipidemia Father    Cancer Brother        MULTIPLE MYELOMA   Diabetes Maternal Aunt    Diabetes Cousin    Diabetes Cousin    Heart  disease Paternal Grandfather    Social History   Socioeconomic History   Marital status: Married    Spouse name: Not on file   Number of children: Not on file   Years of education: Not on file   Highest education level: Not on file  Occupational History   Occupation: Warden/ranger at [employer]    Employer: [employer]  Tobacco Use   Smoking status: Never   Smokeless tobacco: Never  Vaping Use   Vaping Use: Never used  Substance and Sexual Activity   Alcohol use: Yes    Comment: 0-4 a week   Drug use: No   Sexual activity: Yes    Birth control/protection: Post-menopausal    Comment: <5, older than 16  Other Topics Concern   Not on file  Social History Narrative   Married, retired in 2013, 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   Has 3 children, 2 grandchildren Harrells and Latexo , that live in [place], [place].  Exercise - does group exercise, step aerobics, weights, eating healthy.  12/2018   Social Determinants of Health   Financial Resource Strain: Low Risk  (08/04/2022)   Overall Financial Resource Strain (CARDIA)    Difficulty of Paying Living Expenses: Not very hard  Food Insecurity: No Food Insecurity (08/04/2022)   Hunger Vital Sign    Worried About Running Out of Food in the Last Year: Never true    Ran Out of Food in the Last Year: Never true  Transportation Needs: No Transportation Needs (08/04/2022)   PRAPARE - Administrator, Civil Service (Medical): No    Lack of Transportation (Non-Medical): No  Physical Activity: Sufficiently Active (08/04/2022)   Exercise Vital Sign    Days of Exercise per Week: 6 days    Minutes of Exercise per Session: 60 min  Stress: No Stress Concern Present (08/04/2022)   [employer] of Occupational Health - Occupational Stress Questionnaire    Feeling of Stress : Only a little  Social Connections: Unknown (08/04/2022)   Social Connection and Isolation Panel [NHANES]    Frequency of Communication with Friends and Family: Twice a week    Frequency of Social Gatherings with Friends and Family: Twice a week    Attends Religious Services: Not on Marketing executive or Organizations: Yes    Attends Engineer, structural: More than 4 times per year    Marital Status: Married    Tobacco Counseling Counseling given: Not Answered   Clinical Intake:  Pre-visit preparation completed: Yes  Pain : No/denies pain     Nutritional Status: BMI of 19-24  Normal Nutritional Risks: None Diabetes: No  How often do you need to have someone help you when you read instructions, pamphlets, or other written materials from your doctor or pharmacy?: 1 - Never  Interpreter Needed?: No  Information entered by :: NAllen LPN   Activities of  Daily Living    08/04/2022   10:23 AM  In your present state of health, do you have any difficulty performing the following activities:  Hearing? 0  Vision? 0  Difficulty concentrating or making decisions? 0  Walking or climbing stairs? 0  Dressing or bathing? 0  Doing errands, shopping? 0  Preparing Food and eating ? N  Using the Toilet? N  In the past six months, have you accidently leaked urine? Y  Do you have problems with loss of bowel control? N  Managing your Medications? N  Managing your Finances? N  Housekeeping or managing your Housekeeping? N    Patient Care Team: Tysinger, Kermit Balo, PA-C as PCP - General (Family Medicine) Genia Del, MD as Consulting Physician (Obstetrics and Gynecology)  Indicate any recent Medical Services you may have received from other than [facility] providers in the past year (date may be approximate).     Assessment:   This is a routine wellness examination for [facility].  Hearing/Vision screen Vision Screening - Comments:: Regular eye exams, Dr. Jimmey Ralph, [facility]  Dietary issues and exercise activities discussed:     Goals Addressed             This Visit's Progress    Patient Stated       08/08/2022, maintain current health       Depression Screen    08/08/2022    8:48 AM 07/29/2021    8:16 AM 01/24/2021    9:26 AM 07/01/2020   12:18 PM 01/09/2019   11:55 AM  PHQ 2/9 Scores  PHQ - 2 Score 0 0 0 0 0  PHQ- 9 Score 0        Fall Risk    08/04/2022   10:23 AM 07/29/2021    8:15 AM 07/28/2021   11:43 AM 01/24/2021    9:26 AM 01/09/2019   11:55 AM  Fall Risk   Falls in the past year? 0 1 1 1  0  Comment  slipped on step     Number falls in past yr: 0 1 0 0   Injury with Fall? 0 0 1 0   Risk for fall due to : Medication side effect Medication side effect  Impaired balance/gait   Follow up Falls prevention discussed;Education provided;Falls evaluation completed Falls evaluation completed;Education provided;Falls  prevention discussed  Falls evaluation completed     MEDICARE RISK AT HOME:  Medicare Risk at Home - 08/08/22 0847     Any stairs in or around the home? Yes    If so, are there any without handrails? No    Home free of loose throw rugs in walkways, pet beds, electrical cords, etc? Yes    Adequate lighting in your home to reduce risk of falls? Yes    Life alert? No    Use of a cane, walker or w/c? No    Grab bars in the bathroom? No    Shower chair or bench in shower? No    Elevated toilet seat or a handicapped toilet? No             TIMED UP AND GO:  Was the test performed?  No    Cognitive Function:        08/08/2022    8:48 AM 07/29/2021    8:17 AM  6CIT Screen  What Year? 0 points 0 points  What month? 0 points 0 points  What time? 0 points 0 points  Count back from 20 0 points 0 points  Months in reverse 0 points 0 points  Repeat phrase 0 points 0 points  Total Score 0 points 0 points    Immunizations Immunization History  Administered Date(s) Administered   COVID-19, mRNA, vaccine(Comirnaty)12 years and older 12/26/2021   Fluad Quad(high Dose 65+) 12/05/2018, 12/15/2020   Influenza Split 10/31/2011   Influenza,inj,Quad PF,6+ Mos 11/06/2013, 10/27/2015, 11/13/2016, 11/23/2017   Influenza-Unspecified 11/28/2019, 01/05/2022   PFIZER(Purple Top)SARS-COV-2 Vaccination 03/28/2019, 04/22/2019, 12/01/2019, 09/01/2020   Pfizer Covid-19 Vaccine Bivalent Booster 5y-11y 02/01/2021  Pneumococcal Conjugate-13 01/09/2019   Pneumococcal Polysaccharide-23 04/19/2020   Rsv, Bivalent, Protein Subunit Rsvpref,pf Verdis Frederickson) 02/25/2021   Tdap 03/01/2007, 08/08/2021   Zoster Recombinat (Shingrix) 04/18/2021, 07/04/2021   Zoster, Live 10/24/2012    TDAP status: Up to date  Flu Vaccine status: Up to date  Pneumococcal vaccine status: Up to date  Covid-19 vaccine status: Completed vaccines  Qualifies for Shingles Vaccine? Yes   Zostavax completed Yes   Shingrix  Completed?: Yes  Screening Tests Health Maintenance  Topic Date Due   Medicare Annual Wellness (AWV)  07/30/2022   INFLUENZA VACCINE  09/21/2022   MAMMOGRAM  11/24/2023   Colonoscopy  02/23/2030   DTaP/Tdap/Td (3 - Td or Tdap) 08/09/2031   Pneumonia Vaccine 1+ Years old  Completed   DEXA SCAN  Completed   COVID-19 Vaccine  Completed   Hepatitis C Screening  Completed   Zoster Vaccines- Shingrix  Completed   HPV VACCINES  Aged Out    Health Maintenance  Health Maintenance Due  Topic Date Due   Medicare Annual Wellness (AWV)  07/30/2022    Colorectal cancer screening: Type of screening: Colonoscopy. Completed 02/24/2020. Repeat every 10 years  Mammogram status: Completed 11/23/2021. Repeat every year  Bone Density status: Completed 01/04/2021.   Lung Cancer Screening: (Low Dose CT Chest recommended if Age 22-80 years, 20 pack-year currently smoking OR have quit w/in 15years.) does not qualify.   Lung Cancer Screening Referral: no  Additional Screening:  Hepatitis C Screening: does qualify; Completed 10/11/2012  Vision Screening: Recommended annual ophthalmology exams for early detection of glaucoma and other disorders of the eye. Is the patient up to date with their annual eye exam?  Yes  Who is the provider or what is the name of the office in which the patient attends annual eye exams? Dr. Jimmey Ralph If pt is not established with a provider, would they like to be referred to a provider to establish care? No .   Dental Screening: Recommended annual dental exams for proper oral hygiene  Diabetic Foot Exam: n/a  Community Resource Referral / Chronic Care Management: CRR required this visit?  No   CCM required this visit?  No     Plan:     I have personally reviewed and noted the following in the patient's chart:   Medical and social history Use of alcohol, tobacco or illicit drugs  Current medications and supplements including opioid prescriptions. Patient is not  currently taking opioid prescriptions. Functional ability and status Nutritional status Physical activity Advanced directives List of other physicians Hospitalizations, surgeries, and ER visits in previous 12 months Vitals Screenings to include cognitive, depression, and falls Referrals and appointments  In addition, I have reviewed and discussed with patient certain preventive protocols, quality metrics, and best practice recommendations. A written personalized care plan for preventive services as well as general preventive health recommendations were provided to patient.     Barb Merino, LPN   1/61/0960   After Visit Summary: (MyChart) Due to this being a telephonic visit, the after visit summary with patients personalized plan was offered to patient via MyChart   Nurse Notes: none

## 2022-08-10 ENCOUNTER — Ambulatory Visit: Payer: Medicare PPO | Admitting: Medical

## 2022-08-23 ENCOUNTER — Encounter: Payer: Self-pay | Admitting: Medical

## 2022-08-23 ENCOUNTER — Ambulatory Visit (INDEPENDENT_AMBULATORY_CARE_PROVIDER_SITE_OTHER): Payer: Medicare PPO | Admitting: Medical

## 2022-08-23 VITALS — BP 124/80 | HR 52 | Temp 97.7°F | Resp 16 | Ht 67.0 in | Wt 152.2 lb

## 2022-08-23 DIAGNOSIS — Z0000 Encounter for general adult medical examination without abnormal findings: Secondary | ICD-10-CM

## 2022-08-23 DIAGNOSIS — Z780 Asymptomatic menopausal state: Secondary | ICD-10-CM | POA: Diagnosis not present

## 2022-08-23 DIAGNOSIS — R0982 Postnasal drip: Secondary | ICD-10-CM

## 2022-08-23 DIAGNOSIS — M8580 Other specified disorders of bone density and structure, unspecified site: Secondary | ICD-10-CM | POA: Diagnosis not present

## 2022-08-23 DIAGNOSIS — R0989 Other specified symptoms and signs involving the circulatory and respiratory systems: Secondary | ICD-10-CM | POA: Diagnosis not present

## 2022-08-23 DIAGNOSIS — E785 Hyperlipidemia, unspecified: Secondary | ICD-10-CM | POA: Diagnosis not present

## 2022-08-23 DIAGNOSIS — I10 Essential (primary) hypertension: Secondary | ICD-10-CM | POA: Diagnosis not present

## 2022-08-23 LAB — POCT URINALYSIS DIP (PROADVANTAGE DEVICE)
Bilirubin, UA: NEGATIVE
Blood, UA: NEGATIVE
Glucose, UA: NEGATIVE mg/dL
Ketones, POC UA: NEGATIVE mg/dL
Leukocytes, UA: NEGATIVE
Nitrite, UA: NEGATIVE
Protein Ur, POC: NEGATIVE mg/dL
Specific Gravity, Urine: 1.005
Urobilinogen, Ur: 0.2
pH, UA: 6 (ref 5.0–8.0)

## 2022-08-23 LAB — TSH

## 2022-08-23 MED ORDER — AMOXICILLIN 875 MG PO TABS: 875.0000 mg | ORAL_TABLET | Freq: Two times a day (BID) | ORAL | 0 refills | Status: AC

## 2022-08-23 NOTE — Progress Notes (Signed)
Subjective:   HPI  Sherri Rangel is a 67 y.o. female who presents for Chief Complaint  Patient presents with   Annual Exam    Had AWV with [facility] on 08/08/2022.Fasting.     Patient Care Team: Alyanah Elliott, Kermit Balo, PA-C as PCP - General (Family Medicine) Genia Del, MD as Consulting Physician (Obstetrics and Gynecology) Sees dentist Sees eye doctor Dr. Genia Del, gyn Dr. Sharrell Ku, GI Dr. Margo Aye, dermatology   Concerns: Doing except some phlegm and drainage for a month.  Husband had a cold a month ago, gave it to her and despite mucinex and hydration, still has ongoing phlegm and congestion for almost a month  Sees dermatology every 6-12 mo  Reviewed their medical, surgical, family, social, medication, and allergy history and updated chart as appropriate.  Past Medical History:  Diagnosis Date   Anemia    prior to 2016   BCC (basal cell carcinoma)    right eye, LLE   Colon polyp 08/2009   WNL, internal hemorrhoid (Dr. Kinnie Scales); also had colonoscopy 2008 with Dr. Leone Payor   Encounter for routine gynecological examination    sees Dr. Reynaldo Minium   H/O bone density study    normal 2017, 2015   Hyperlipidemia    Hypertension    Iron deficiency 06/2009   negative EGD and colonoscopy 08/2009   Menopausal state    Squamous cell carcinoma, leg, right 2021   Vaginal atrophy     Past Surgical History:  Procedure Laterality Date   BREAST SURGERY     RIGHT BREAST BIOPSY/FCD   COLONOSCOPY  09/2014   Dr. Kinnie Scales, prior 2011.   due back 2021, hx/o polyps   skin tags      Family History  Problem Relation Age of Onset   Cancer Mother 82       ovarian cancer   Hypertension Mother    Osteoporosis Mother    Cancer Father        esophageal   Alcohol abuse Father    Hyperlipidemia Father    Cancer Brother        MULTIPLE MYELOMA   Diabetes Maternal Aunt    Diabetes Cousin    Diabetes Cousin    Heart disease Paternal Grandfather      Current  Outpatient Medications:    amoxicillin (AMOXIL) 875 MG tablet, Take 1 tablet (875 mg total) by mouth 2 (two) times daily for 10 days., Disp: 14 tablet, Rfl: 0   Calcium-Magnesium-Vitamin D (CALCIUM 1200+D3 PO), , Disp: , Rfl:    Cholecalciferol (VITAMIN D) 50 MCG (2000 UT) CAPS, , Disp: , Rfl:    Coenzyme Q10 (CO Q 10 PO), Take by mouth., Disp: , Rfl:    cycloSPORINE (RESTASIS) 0.05 % ophthalmic emulsion, , Disp: , Rfl:    Glucosamine-Chondroit-Vit C-Mn (GLUCOSAMINE 1500 COMPLEX PO), , Disp: , Rfl:    lisinopril (ZESTRIL) 2.5 MG tablet, TAKE 1 TABLET (2.5 MG TOTAL) BY MOUTH DAILY., Disp: 90 tablet, Rfl: 0   Multiple Vitamin (MULTIVITAMIN) tablet, Take 1 tablet by mouth daily., Disp: , Rfl:    RA KRILL OIL PO, Take 750 mg by mouth daily., Disp: , Rfl:    rosuvastatin (CRESTOR) 5 MG tablet, TAKE 1 TABLET (5 MG TOTAL) BY MOUTH AT BEDTIME., Disp: 90 tablet, Rfl: 0   Turmeric 500 MG CAPS, Take by mouth., Disp: , Rfl:   No Known Allergies  Review of Systems  Constitutional:  Negative for chills, fever, malaise/fatigue and weight loss.  HENT:  Positive for congestion. Negative for ear pain, hearing loss, sore throat and tinnitus.   Eyes:  Negative for blurred vision, pain and redness.  Respiratory:  Negative for cough, hemoptysis and shortness of breath.   Cardiovascular:  Negative for chest pain, palpitations, orthopnea, claudication and leg swelling.  Gastrointestinal:  Negative for abdominal pain, blood in stool, constipation, diarrhea, nausea and vomiting.  Genitourinary:  Negative for dysuria, flank pain, frequency, hematuria and urgency.  Musculoskeletal:  Negative for falls, joint pain and myalgias.  Skin:  Negative for itching and rash.  Neurological:  Negative for dizziness, tingling, speech change, weakness and headaches.  Endo/Heme/Allergies:  Negative for polydipsia. Does not bruise/bleed easily.  Psychiatric/Behavioral:  Negative for depression and memory loss. The patient is not  nervous/anxious and does not have insomnia.         08/08/2022    8:48 AM 07/29/2021    8:16 AM 01/24/2021    9:26 AM 07/01/2020   12:18 PM 01/09/2019   11:55 AM  Depression screen PHQ 2/9  Decreased Interest 0 0 0 0 0  Down, Depressed, Hopeless 0 0 0 0 0  PHQ - 2 Score 0 0 0 0 0  Altered sleeping 0      Tired, decreased energy 0      Change in appetite 0      Feeling bad or failure about yourself  0      Trouble concentrating 0      Moving slowly or fidgety/restless 0      Suicidal thoughts 0      PHQ-9 Score 0      Difficult doing work/chores Not difficult at all            Objective:  BP 124/80   Pulse (!) 52   Temp 97.7 F (36.5 C) (Oral)   Resp 16   Ht 5\' 7"  (1.702 m)   Wt 152 lb 3.2 oz (69 kg)   SpO2 96% Comment: room air  BMI 23.84 kg/m   Wt Readings from Last 3 Encounters:  08/23/22 152 lb 3.2 oz (69 kg)  08/08/22 151 lb (68.5 kg)  12/16/21 152 lb (68.9 kg)    General appearance: alert, no distress, WD/WN, Caucasian female Skin: scattered macules, no worrisome lesions, scattered scars on lower legs from prior biopsies HEENT: normocephalic, conjunctiva/corneas normal, sclerae anicteric, PERRLA, EOMi, nares with mild mucoid discharge, +erythema, pharynx with some mild post nasal drainage Oral cavity: MMM, tongue normal, teeth in good repair Neck: supple, no lymphadenopathy, no thyromegaly, no masses, normal ROM, no bruits Chest: non tender, normal shape and expansion Heart: RRR, normal S1, S2, no murmurs Lungs: CTA bilaterally, no wheezes, rhonchi, or rales Abdomen: +bs, soft, non tender, non distended, no masses, no hepatomegaly, no splenomegaly, no bruits Back: non tender, normal ROM, no scoliosis Musculoskeletal: upper extremities non tender, no obvious deformity, normal ROM throughout, lower extremities non tender, no obvious deformity, normal ROM throughout Extremities: no edema, no cyanosis, no clubbing Pulses: 2+ symmetric, upper and lower  extremities, normal cap refill Neurological: alert, oriented x 3, CN2-12 intact, strength normal upper extremities and lower extremities, sensation normal throughout, DTRs 2+ throughout, no cerebellar signs, gait normal Psychiatric: normal affect, behavior normal, pleasant  Breast/gyn/rectal - deferred to gyn    Assessment and Plan :   Encounter Diagnoses  Name Primary?   Encounter for health maintenance examination in adult Yes   Hyperlipidemia, unspecified hyperlipidemia type    Essential hypertension, benign    Osteopenia, unspecified  location    Postmenopausal estrogen deficiency    Post-nasal drainage    Phlegm in throat      This visit was a preventative care visit, also known as wellness visit or routine physical.   Topics typically include healthy lifestyle, diet, exercise, preventative care, vaccinations, sick and well care, proper use of emergency dept and after hours care, as well as other concerns.     Recommendations: Continue to return yearly for your annual wellness and preventative care visits.  This gives [place] a chance to discuss healthy lifestyle, exercise, vaccinations, review your chart record, and perform screenings where appropriate.  I recommend you see your eye doctor yearly for routine vision care.  I recommend you see your dentist yearly for routine dental care including hygiene visits twice yearly.  See your gynecologist yearly for routine gynecological care.   Vaccination recommendations were reviewed Immunization History  Administered Date(s) Administered   COVID-19, mRNA, vaccine(Comirnaty)12 years and older 12/26/2021   Fluad Quad(high Dose 65+) 12/05/2018, 12/15/2020   Influenza Split 10/31/2011   Influenza,inj,Quad PF,6+ Mos 11/06/2013, 10/27/2015, 11/13/2016, 11/23/2017   Influenza-Unspecified 11/28/2019, 01/05/2022   PFIZER(Purple Top)SARS-COV-2 Vaccination 03/28/2019, 04/22/2019, 12/01/2019, 09/01/2020   Pfizer Covid-19 Vaccine Bivalent  Booster 5y-11y 02/01/2021   Pneumococcal Conjugate-13 01/09/2019   Pneumococcal Polysaccharide-23 04/19/2020   Rsv, Bivalent, Protein Subunit Rsvpref,pf Verdis Frederickson) 02/25/2021   Tdap 03/01/2007, 08/08/2021   Zoster Recombinant(Shingrix) 04/18/2021, 07/04/2021   Zoster, Live 10/24/2012    Screening for cancer: Colon cancer screening: I reviewed your colonoscopy on file that is up to date from 02/2020, due repeat in 5 years  Mammogram and pap reviewed, up to date  Skin cancer screening: Check your skin regularly for new changes, growing lesions, or other lesions of concern Come in for evaluation if you have skin lesions of concern.  Lung cancer screening: If you have a greater than 20 pack year history of tobacco use, then you may qualify for lung cancer screening with a chest CT scan.   Please call your insurance company to inquire about coverage for this test.  We currently don't have screenings for other cancers besides breast, cervical, colon, and lung cancers.  If you have a strong family history of cancer or have other cancer screening concerns, please let me know.    Bone health: Get at least 150 minutes of aerobic exercise weekly Get weight bearing exercise at least once weekly Bone density test:  A bone density test is an imaging test that uses a type of X-ray to measure the amount of calcium and other minerals in your bones. The test may be used to diagnose or screen you for a condition that causes weak or thin bones (osteoporosis), predict your risk for a broken bone (fracture), or determine how well your osteoporosis treatment is working. The bone density test is recommended for females 65 and older, or females or males <65 if certain risk factors such as thyroid disease, long term use of steroids such as for asthma or rheumatological issues, vitamin D deficiency, estrogen deficiency, family history of osteoporosis, self or family history of fragility fracture in first degree  relative.  12/2020 bone density shows osteopenia  Please call to schedule your bone density test.   [facility]  [phone] [address], [address] [place], [place] [address]    Heart health: Get at least 150 minutes of aerobic exercise weekly Limit alcohol It is important to maintain a healthy blood pressure and healthy cholesterol numbers  Heart disease screening: Screening for heart disease includes screening for blood pressure, fasting lipids, glucose/diabetes screening, BMI height to weight ratio, reviewed of smoking status, physical activity, and diet.    Goals include blood pressure 120/80 or less, maintaining a healthy lipid/cholesterol profile, preventing diabetes or keeping diabetes numbers under good control, not smoking or using tobacco products, exercising most days per week or at least 150 minutes per week of exercise, and eating healthy variety of fruits and vegetables, healthy oils, and avoiding unhealthy food choices like fried food, fast food, high sugar and high cholesterol foods.    Other tests may possibly include EKG test, CT coronary calcium score, echocardiogram, exercise treadmill stress test.   Consider CT coronary test   Medical care options: I recommend you continue to seek care here first for routine care.  We try really hard to have available appointments Monday through Friday daytime hours for sick visits, acute visits, and physicals.  Urgent care should be used for after hours and weekends for significant issues that cannot wait till the next day.  The emergency department should be used for significant potentially life-threatening emergencies.  The emergency department is expensive, can often have long wait times for less significant concerns, so try to utilize primary care, urgent care, or telemedicine when possible to avoid unnecessary trips to the emergency department.  Virtual visits and telemedicine have been  introduced since the pandemic started in 2020, and can be convenient ways to receive medical care.  We offer virtual appointments as well to assist you in a variety of options to seek medical care.   Advanced Directives: I recommend you consider completing a Health Care Power of Attorney and Living Will.   These documents respect your wishes and help alleviate burdens on your loved ones if you were to become terminally ill or be in a position to need those documents enforced.    You can complete Advanced Directives yourself, have them notarized, then have copies made for our office, for you and for anybody you feel should have them in safe keeping.  Or, you can have an attorney prepare these documents.   If you haven't updated your Last Will and Testament in a while, it may be worthwhile having an attorney prepare these documents together and save on some costs.       Separate significant issues discussed: Hyperlipidemia - continue current medication, labs today  Hypertension - controlled on low dose medication, lisinopril 2.5mg  daily  C/t vit D supplement  Osteopenia - continue ca+D supplement, weight bearing and aerobic exercise, update bone density at this time  Phlegm, post nasal drainage -no improvement with Mucinex and symptoms been going on for close to a month.  Begin a round of amoxicillin as below.,  Continue good water intake  Haunani was seen today for annual exam.  Diagnoses and all orders for this visit:  Encounter for health maintenance examination in adult -     Comprehensive metabolic panel -     CBC -     Lipid panel -     POCT Urinalysis DIP (Proadvantage Device) -     TSH  Hyperlipidemia, unspecified hyperlipidemia type -     Lipid panel  Essential hypertension, benign  Osteopenia, unspecified location -     TSH -     DG Bone Density; Future  Postmenopausal estrogen deficiency -     TSH -     DG Bone Density; Future  Post-nasal drainage  Phlegm in  throat  Other orders -     amoxicillin (AMOXIL) 875 MG tablet; Take 1 tablet (875 mg total) by mouth 2 (two) times daily for 10 days.    Follow-up pending labs, yearly for physical

## 2022-08-23 NOTE — Patient Instructions (Signed)
Please call to schedule your bone density test   [facility]  [phone] [address], [address] [place], [place] [address]   

## 2022-08-24 LAB — COMPREHENSIVE METABOLIC PANEL
ALT: 19 IU/L (ref 0–32)
AST: 25 IU/L (ref 0–40)
Albumin: 4.6 g/dL (ref 3.9–4.9)
Alkaline Phosphatase: 84 IU/L (ref 44–121)
BUN/Creatinine Ratio: 17 (ref 12–28)
BUN: 13 mg/dL (ref 8–27)
Bilirubin Total: 0.5 mg/dL (ref 0.0–1.2)
CO2: 26 mmol/L (ref 20–29)
Calcium: 9.5 mg/dL (ref 8.7–10.3)
Chloride: 103 mmol/L (ref 96–106)
Creatinine, Ser: 0.78 mg/dL (ref 0.57–1.00)
Globulin, Total: 2.3 g/dL (ref 1.5–4.5)
Glucose: 93 mg/dL (ref 70–99)
Potassium: 5.2 mmol/L (ref 3.5–5.2)
Sodium: 141 mmol/L (ref 134–144)
Total Protein: 6.9 g/dL (ref 6.0–8.5)
eGFR: 82 mL/min/{1.73_m2} (ref >59)

## 2022-08-24 LAB — CBC
Hematocrit: 41.5 % (ref 34.0–46.6)
Hemoglobin: 13.2 g/dL (ref 11.1–15.9)
MCH: 29 pg (ref 26.6–33.0)
MCHC: 31.8 g/dL (ref 31.5–35.7)
MCV: 91 fL (ref 79–97)
Platelets: 257 10*3/uL (ref 150–450)
RBC: 4.55 x10E6/uL (ref 3.77–5.28)
RDW: 13.2 % (ref 11.7–15.4)
WBC: 4.7 10*3/uL (ref 3.4–10.8)

## 2022-08-24 LAB — LIPID PANEL
Chol/HDL Ratio: 2.6 ratio (ref 0.0–4.4)
Cholesterol, Total: 147 mg/dL (ref 100–199)
HDL: 56 mg/dL (ref >39)
LDL Chol Calc (NIH): 77 mg/dL (ref 0–99)
Triglycerides: 73 mg/dL (ref 0–149)
VLDL Cholesterol Cal: 14 mg/dL (ref 5–40)

## 2022-08-27 ENCOUNTER — Other Ambulatory Visit: Payer: Self-pay | Admitting: Medical

## 2022-08-27 MED ORDER — ROSUVASTATIN CALCIUM 5 MG PO TABS: 5.0000 mg | ORAL_TABLET | Freq: Every day | ORAL | 3 refills | Status: DC

## 2022-08-27 MED ORDER — LISINOPRIL 2.5 MG PO TABS: 2.5000 mg | ORAL_TABLET | Freq: Every day | ORAL | 3 refills | Status: DC

## 2022-08-27 NOTE — Progress Notes (Signed)
Results sent through MyChart

## 2022-10-25 DIAGNOSIS — Z85828 Personal history of other malignant neoplasm of skin: Secondary | ICD-10-CM | POA: Diagnosis not present

## 2022-10-25 DIAGNOSIS — L570 Actinic keratosis: Secondary | ICD-10-CM | POA: Diagnosis not present

## 2022-10-25 DIAGNOSIS — L820 Inflamed seborrheic keratosis: Secondary | ICD-10-CM | POA: Diagnosis not present

## 2022-10-25 DIAGNOSIS — Z08 Encounter for follow-up examination after completed treatment for malignant neoplasm: Secondary | ICD-10-CM | POA: Diagnosis not present

## 2022-10-25 DIAGNOSIS — X32XXXD Exposure to sunlight, subsequent encounter: Secondary | ICD-10-CM | POA: Diagnosis not present

## 2022-11-03 ENCOUNTER — Other Ambulatory Visit: Payer: Self-pay | Admitting: Medical

## 2022-11-03 DIAGNOSIS — Z780 Asymptomatic menopausal state: Secondary | ICD-10-CM

## 2022-11-03 DIAGNOSIS — M8580 Other specified disorders of bone density and structure, unspecified site: Secondary | ICD-10-CM

## 2022-11-22 ENCOUNTER — Ambulatory Visit: Payer: Medicare PPO | Admitting: Medical

## 2022-11-22 VITALS — BP 120/74 | HR 64 | Temp 97.9°F | Wt 152.8 lb

## 2022-11-22 DIAGNOSIS — M779 Enthesopathy, unspecified: Secondary | ICD-10-CM | POA: Diagnosis not present

## 2022-11-22 DIAGNOSIS — R202 Paresthesia of skin: Secondary | ICD-10-CM | POA: Diagnosis not present

## 2022-11-22 DIAGNOSIS — M25522 Pain in left elbow: Secondary | ICD-10-CM

## 2022-11-22 NOTE — Progress Notes (Signed)
Subjective:  Sherri Rangel is a 67 y.o. female who presents for Chief Complaint  Patient presents with   Elbow Pain    Elbow pain x a couple weeks. Not getting any better     Here for left elbow pain.  A nurse friend thinks its the ulnar nerve.  Sherri Rangel thinks its tendonitis.  Pain is mostly left medial elbow.  Pain does radiate up the upper arm a little and down arm to pinky and ring finger.   Gets tingly.  No obvious numbness but sensation can seem different.   Felt like something was loose in elbow.  Does sometimes rest arm on elbows.  Turning certain ways makes it worse.  Hasn't golfed in a few weeks, but does golf.  Has been doing some weightlifting.  Started after doing some weight lifting.    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    Right handed but uses left arm a lot as well.  No other aggravating or relieving factors.    No other c/o.  The following portions of the patient's history were reviewed and updated as appropriate: allergies, current medications, past family history, past medical history, past social history, past surgical history and problem list.  ROS Otherwise as in subjective above    Objective: BP 120/74   Pulse 64   Temp 97.9 F (36.6 C)   Wt 152 lb 12.8 oz (69.3 kg)   BMI 23.93 kg/m   General appearance: alert, no distress, well developed, well nourished Tender left medial elbow along the medial epicondyle and along cubital tunnel No swelling, no deformity, there is pain with resisted pronation of the left arm. Rest of the forearm and fingers and hand nontender Arms neurovascularly intact normal strength and sensation Arm pulses and cap refill normal    Assessment: Encounter Diagnoses  Name Primary?   Elbow pain, left Yes   Tendonitis    Paresthesia of left arm      Plan: We discussed symptoms and concerns.  She may have a bit of both  tinnitus and cubital tunnel syndrome.  Referral to physical therapy for further evaluation and treatment Can use Aleve over-the-counter once or twice daily for the next few days and then as needed, cold therapy, elevation, rest. We discussed compression sleeve and splinting but she will wait until she sees physical therapy about which splint or sleeve they recommend    Sherri Rangel was seen today for elbow pain.  Diagnoses and all orders for this visit:  Elbow pain, left -     Ambulatory referral to Physical Therapy  Tendonitis -     Ambulatory referral to Physical Therapy  Paresthesia of left arm -     Ambulatory referral to Physical Therapy    Follow up: pending PT

## 2022-11-27 DIAGNOSIS — Z1231 Encounter for screening mammogram for malignant neoplasm of breast: Secondary | ICD-10-CM | POA: Diagnosis not present

## 2022-11-27 LAB — HM MAMMOGRAPHY

## 2022-12-13 DIAGNOSIS — M25522 Pain in left elbow: Secondary | ICD-10-CM | POA: Diagnosis not present

## 2022-12-13 DIAGNOSIS — S5402XA Injury of ulnar nerve at forearm level, left arm, initial encounter: Secondary | ICD-10-CM | POA: Diagnosis not present

## 2022-12-18 DIAGNOSIS — S5402XA Injury of ulnar nerve at forearm level, left arm, initial encounter: Secondary | ICD-10-CM | POA: Diagnosis not present

## 2022-12-18 DIAGNOSIS — Z6823 Body mass index (BMI) 23.0-23.9, adult: Secondary | ICD-10-CM | POA: Diagnosis not present

## 2022-12-18 DIAGNOSIS — M25522 Pain in left elbow: Secondary | ICD-10-CM | POA: Diagnosis not present

## 2022-12-18 DIAGNOSIS — Z124 Encounter for screening for malignant neoplasm of cervix: Secondary | ICD-10-CM | POA: Diagnosis not present

## 2022-12-18 DIAGNOSIS — N3945 Continuous leakage: Secondary | ICD-10-CM | POA: Diagnosis not present

## 2022-12-25 ENCOUNTER — Encounter: Payer: Self-pay | Admitting: Medical

## 2022-12-25 DIAGNOSIS — S5402XA Injury of ulnar nerve at forearm level, left arm, initial encounter: Secondary | ICD-10-CM | POA: Diagnosis not present

## 2022-12-25 DIAGNOSIS — M25522 Pain in left elbow: Secondary | ICD-10-CM | POA: Diagnosis not present

## 2022-12-26 ENCOUNTER — Ambulatory Visit: Payer: Medicare PPO | Admitting: Medical

## 2022-12-27 DIAGNOSIS — S5402XA Injury of ulnar nerve at forearm level, left arm, initial encounter: Secondary | ICD-10-CM | POA: Diagnosis not present

## 2022-12-27 DIAGNOSIS — M25522 Pain in left elbow: Secondary | ICD-10-CM | POA: Diagnosis not present

## 2023-01-22 ENCOUNTER — Ambulatory Visit: Payer: Medicare PPO | Admitting: Medical

## 2023-01-22 VITALS — BP 110/70 | HR 69 | Wt 154.4 lb

## 2023-01-22 DIAGNOSIS — R202 Paresthesia of skin: Secondary | ICD-10-CM | POA: Diagnosis not present

## 2023-01-22 DIAGNOSIS — M779 Enthesopathy, unspecified: Secondary | ICD-10-CM

## 2023-01-22 DIAGNOSIS — M25522 Pain in left elbow: Secondary | ICD-10-CM | POA: Diagnosis not present

## 2023-01-22 NOTE — Progress Notes (Signed)
Subjective:  Sherri Rangel is a 67 y.o. female who presents for Chief Complaint  Patient presents with   Follow-up    Follow-up on elbow pain.      Here for recheck on left elbow pain.   Last visit she can in for pain is mostly left medial elbow.  Pain does radiate up the upper arm a little and down arm to pinky and ring finger.   Gets tingly.  No obvious numbness but sensation can seem different.   Felt like something was loose in elbow.  Does sometimes rest arm on elbows.  Turning certain ways makes it worse.    Last visit we referred to physical therapy.  Went to physical therapy 3-4 visits.   Therapy has been helpful. Symptoms not resolved but improving.  So far probably 50% improved.   Feels like she could reinjure easily though.  Moved furniture over thanksgiving and inflamed her arm again.    Hasn't golfed in a few weeks, but does golf.  Had been doing some weightlifting.  The symptoms started after doing some weight lifting.    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    Right handed but uses left arm a lot as well.  Used some ice initially.  Not currently using aleve.  Numbness from last visit is way better.   No other aggravating or relieving factors.    No other c/o.  The following portions of the patient's history were reviewed and updated as appropriate: allergies, current medications, past family history, past medical history, past social history, past surgical history and problem list.  ROS Otherwise as in subjective above    Objective: BP 110/70   Pulse 69   Wt 154 lb 6.4 oz (70 kg)   BMI 24.18 kg/m   General appearance: alert, no distress, well developed, well nourished Tender left medial elbow along the medial epicondyle and along cubital tunnel No swelling, no deformity, there is pain with resisted pronation of the left arm. Rest of the forearm and fingers and  hand nontender Arms neurovascularly intact normal strength and sensation Arm pulses and cap refill normal    Assessment: Encounter Diagnoses  Name Primary?   Left elbow pain Yes   Tendonitis    Paresthesia of arm       Plan: We discussed symptoms and concerns.  Last visit symptoms suggested both tinnitus and cubital tunnel syndrome.  She is improving.  Fortunately the paresthesias has significantly improved.  Continue with home exercises, stretching, and physical therapy  Can use Aleve over-the-counter once or twice daily as needed, cold therapy, elevation, rest.   Teaya was seen today for follow-up.  Diagnoses and all orders for this visit:  Left elbow pain  Tendonitis  Paresthesia of arm    Follow up: with physical therapy, and call report in 3-4 weeks after finishing physical therapy

## 2023-02-07 DIAGNOSIS — H5203 Hypermetropia, bilateral: Secondary | ICD-10-CM | POA: Diagnosis not present

## 2023-02-07 DIAGNOSIS — H2513 Age-related nuclear cataract, bilateral: Secondary | ICD-10-CM | POA: Diagnosis not present

## 2023-02-07 DIAGNOSIS — H52221 Regular astigmatism, right eye: Secondary | ICD-10-CM | POA: Diagnosis not present

## 2023-02-07 DIAGNOSIS — H04123 Dry eye syndrome of bilateral lacrimal glands: Secondary | ICD-10-CM | POA: Diagnosis not present

## 2023-02-07 DIAGNOSIS — H43811 Vitreous degeneration, right eye: Secondary | ICD-10-CM | POA: Diagnosis not present

## 2023-02-07 DIAGNOSIS — H02834 Dermatochalasis of left upper eyelid: Secondary | ICD-10-CM | POA: Diagnosis not present

## 2023-02-07 DIAGNOSIS — H524 Presbyopia: Secondary | ICD-10-CM | POA: Diagnosis not present

## 2023-03-28 ENCOUNTER — Other Ambulatory Visit: Payer: Self-pay | Admitting: Medical Genetics

## 2023-03-29 ENCOUNTER — Other Ambulatory Visit (HOSPITAL_COMMUNITY)
Admission: RE | Admit: 2023-03-29 | Discharge: 2023-03-29 | Disposition: A | Discharge status: Home / Self Care | Payer: Self-pay | Source: Ambulatory Visit | Attending: Medical Genetics | Admitting: Medical Genetics

## 2023-04-13 LAB — GENECONNECT MOLECULAR SCREEN: Genetic Analysis Overall Interpretation: NEGATIVE

## 2023-05-15 ENCOUNTER — Ambulatory Visit
Admission: RE | Admit: 2023-05-15 | Discharge: 2023-05-15 | Disposition: A | Discharge status: Home / Self Care | Payer: Medicare PPO | Source: Ambulatory Visit | Attending: Medical | Admitting: Medical

## 2023-05-15 DIAGNOSIS — N958 Other specified menopausal and perimenopausal disorders: Secondary | ICD-10-CM | POA: Diagnosis not present

## 2023-05-15 DIAGNOSIS — L814 Other melanin hyperpigmentation: Secondary | ICD-10-CM | POA: Diagnosis not present

## 2023-05-15 DIAGNOSIS — L820 Inflamed seborrheic keratosis: Secondary | ICD-10-CM | POA: Diagnosis not present

## 2023-05-15 DIAGNOSIS — E2839 Other primary ovarian failure: Secondary | ICD-10-CM | POA: Diagnosis not present

## 2023-05-15 DIAGNOSIS — Z780 Asymptomatic menopausal state: Secondary | ICD-10-CM

## 2023-05-15 DIAGNOSIS — M8580 Other specified disorders of bone density and structure, unspecified site: Secondary | ICD-10-CM

## 2023-05-15 DIAGNOSIS — L821 Other seborrheic keratosis: Secondary | ICD-10-CM | POA: Diagnosis not present

## 2023-05-15 DIAGNOSIS — M8588 Other specified disorders of bone density and structure, other site: Secondary | ICD-10-CM | POA: Diagnosis not present

## 2023-05-15 DIAGNOSIS — X32XXXD Exposure to sunlight, subsequent encounter: Secondary | ICD-10-CM | POA: Diagnosis not present

## 2023-05-15 DIAGNOSIS — L570 Actinic keratosis: Secondary | ICD-10-CM | POA: Diagnosis not present

## 2023-05-15 NOTE — Progress Notes (Signed)
 Results sent through MyChart

## 2023-08-27 ENCOUNTER — Encounter: Payer: Self-pay | Admitting: Medical

## 2023-08-27 ENCOUNTER — Ambulatory Visit (INDEPENDENT_AMBULATORY_CARE_PROVIDER_SITE_OTHER): Payer: Medicare PPO | Admitting: Medical

## 2023-08-27 VITALS — BP 124/80 | HR 68 | Ht 67.5 in | Wt 156.0 lb

## 2023-08-27 DIAGNOSIS — I10 Essential (primary) hypertension: Secondary | ICD-10-CM

## 2023-08-27 DIAGNOSIS — M8580 Other specified disorders of bone density and structure, unspecified site: Secondary | ICD-10-CM

## 2023-08-27 DIAGNOSIS — E785 Hyperlipidemia, unspecified: Secondary | ICD-10-CM

## 2023-08-27 DIAGNOSIS — Z7189 Other specified counseling: Secondary | ICD-10-CM | POA: Diagnosis not present

## 2023-08-27 DIAGNOSIS — Z780 Asymptomatic menopausal state: Secondary | ICD-10-CM | POA: Diagnosis not present

## 2023-08-27 DIAGNOSIS — M545 Low back pain, unspecified: Secondary | ICD-10-CM

## 2023-08-27 DIAGNOSIS — E559 Vitamin D deficiency, unspecified: Secondary | ICD-10-CM

## 2023-08-27 DIAGNOSIS — Z1389 Encounter for screening for other disorder: Secondary | ICD-10-CM | POA: Diagnosis not present

## 2023-08-27 DIAGNOSIS — G8929 Other chronic pain: Secondary | ICD-10-CM

## 2023-08-27 DIAGNOSIS — Z0000 Encounter for general adult medical examination without abnormal findings: Secondary | ICD-10-CM

## 2023-08-27 DIAGNOSIS — Z139 Encounter for screening, unspecified: Secondary | ICD-10-CM

## 2023-08-27 MED ORDER — LISINOPRIL 2.5 MG PO TABS: 2.5000 mg | ORAL_TABLET | Freq: Every day | ORAL | 3 refills | Status: AC

## 2023-08-27 MED ORDER — ROSUVASTATIN CALCIUM 5 MG PO TABS: 5.0000 mg | ORAL_TABLET | Freq: Every day | ORAL | 3 refills | Status: AC

## 2023-08-27 NOTE — Addendum Note (Signed)
 Addended by: BULAH ALM RAMAN on: 08/27/2023 08:48 AM   Modules accepted: Orders

## 2023-08-27 NOTE — Progress Notes (Signed)
 Subjective:    Sherri Rangel is a 68 y.o. female who presents for physical.  She is nonfasting today  Primary Care Provider Ludwin Flahive, Alm RAMAN, PA-C here for primary care  Current Health Care Team: Dentist, [address] Amaryllis Kitty, Eye doctor, [place] Dr. Reyes Plough, GI Duwaine Blumenthal, DO,  [facility] Dr. Shona, Dermatology Dr. Colon, Neurosurgery    Medical Services you may have received from other than [facility] providers in the past year (date may be approximate) gyn  Exercise Current exercise habits: yoga, some strength training/group class, walking; water aerobics in summer.  Nutrition/Diet Current diet: in general, a healthy diet    Depression Screen    08/27/2023    8:14 AM  Depression screen PHQ 2/9  Decreased Interest 0  Down, Depressed, Hopeless 0  PHQ - 2 Score 0    Activities of Daily Living Screen/Functional Status Survey    Can patient draw a clock face showing 3:15 oclock, yes  Fall Risk Screen    08/27/2023    8:13 AM 08/04/2022   10:23 AM 07/29/2021    8:15 AM 07/28/2021   11:43 AM 01/24/2021    9:26 AM  Fall Risk   Falls in the past year? 0 0 1 1 1   Comment   slipped on step    Number falls in past yr: 0 0 1 0 0  Injury with Fall? 0 0 0 1 0  Risk for fall due to : No Fall Risks Medication side effect Medication side effect  Impaired balance/gait  Follow up Falls evaluation completed Falls prevention discussed;Education provided;Falls evaluation completed Falls evaluation completed;Education provided;Falls prevention discussed   Falls evaluation completed      Data saved with a previous flowsheet row definition    Gait Assessment: Normal gait observed yes  Advanced directives Does patient have a Health Care Power of Attorney? No Does patient have a Living Will? No  Past Medical History:  Diagnosis Date   Anemia    prior to 2016   BCC (basal cell carcinoma)    right eye, LLE   Colon polyp 08/2009   WNL, internal hemorrhoid (Dr.  Plough); also had colonoscopy 2008 with Dr. Avram   Encounter for routine gynecological examination    sees Dr. Curlee Guan   H/O bone density study    normal 2017, 2015   Hyperlipidemia    Hypertension    Iron deficiency 06/2009   negative EGD and colonoscopy 08/2009   Menopausal state    Squamous cell carcinoma, leg, right 2021   Vaginal atrophy     Past Surgical History:  Procedure Laterality Date   BREAST SURGERY     RIGHT BREAST BIOPSY/FCD   COLONOSCOPY  09/2014   Dr. Plough, prior 2011.   due back 2021, hx/o polyps   skin tags      Social History   Socioeconomic History   Marital status: Married    Spouse name: Not on file   Number of children: Not on file   Years of education: Not on file   Highest education level: Bachelor's degree (e.g., BA, AB, BS)  Occupational History   Occupation: Warden/ranger at [employer]    Employer: [employer]  Tobacco Use   Smoking status: Never   Smokeless tobacco: Never  Vaping Use   Vaping status: Never Used  Substance and Sexual Activity   Alcohol use: Yes    Comment: 0-4 a week   Drug use: No  Sexual activity: Yes    Birth control/protection: Post-menopausal    Comment: <5, older than 21  Other Topics Concern   Not on file  Social History Narrative   Married, retired in 2013.  Has 3 children, 2 grandchildren [place] and [place] , that live in [place], [place].  Exercise - does group exercise, step aerobics, weights, eating healthy.  08/2023   Social Drivers of Health   Financial Resource Strain: Low Risk  (01/21/2023)   Overall Financial Resource Strain (CARDIA)    Difficulty of Paying Living Expenses: Not very hard  Food Insecurity: No Food Insecurity (01/21/2023)   Hunger Vital Sign    Worried About Running Out of Food in the Last Year: Never true    Ran Out of Food in the Last Year: Never true  Transportation Needs: No Transportation Needs (01/21/2023)   PRAPARE - Scientist, research (physical sciences) (Medical): No    Lack of Transportation (Non-Medical): No  Physical Activity: Sufficiently Active (01/21/2023)   Exercise Vital Sign    Days of Exercise per Week: 5 days    Minutes of Exercise per Session: 60 min  Stress: Stress Concern Present (01/21/2023)   [employer] of Occupational Health - Occupational Stress Questionnaire    Feeling of Stress : To some extent  Social Connections: Socially Integrated (01/21/2023)   Social Connection and Isolation Panel    Frequency of Communication with Friends and Family: Three times a week    Frequency of Social Gatherings with Friends and Family: Once a week    Attends Religious Services: More than 4 times per year    Active Member of [employer] or Organizations: Yes    Attends Engineer, structural: More than 4 times per year    Marital Status: Married  Catering manager Violence: Not At Risk (08/23/2022)   Humiliation, Afraid, Rape, and Kick questionnaire    Fear of Current or Ex-Partner: No    Emotionally Abused: No    Physically Abused: No    Sexually Abused: No    Family History  Problem Relation Age of Onset   Cancer Mother 40       ovarian cancer   Hypertension Mother    Osteoporosis Mother    Cancer Father        esophageal   Alcohol abuse Father    Hyperlipidemia Father    Cancer Brother        MULTIPLE MYELOMA   Diabetes Maternal Aunt    Diabetes Cousin    Diabetes Cousin    Heart disease Paternal Grandfather      Current Outpatient Medications:    Calcium -Magnesium-Vitamin D  (CALCIUM  1200+D3 PO), , Disp: , Rfl:    Cholecalciferol (VITAMIN D ) 50 MCG (2000 UT) CAPS, , Disp: , Rfl:    Coenzyme Q10 (CO Q 10 PO), Take by mouth., Disp: , Rfl:    cycloSPORINE (RESTASIS) 0.05 % ophthalmic emulsion, , Disp: , Rfl:    Glucosamine-Chondroit-Vit C-Mn (GLUCOSAMINE 1500 COMPLEX PO), , Disp: , Rfl:    lisinopril  (ZESTRIL ) 2.5 MG tablet, Take 1 tablet (2.5 mg total) by mouth daily., Disp: 90 tablet, Rfl: 3    Multiple Vitamin (MULTIVITAMIN) tablet, Take 1 tablet by mouth daily., Disp: , Rfl:    RA KRILL OIL PO, Take 750 mg by mouth daily., Disp: , Rfl:    rosuvastatin  (CRESTOR ) 5 MG tablet, Take 1 tablet (5 mg total) by mouth at bedtime., Disp: 90 tablet, Rfl: 3   tretinoin (RETIN-A) 0.025 %  cream, Apply topically at bedtime., Disp: , Rfl:    Turmeric 500 MG CAPS, Take by mouth., Disp: , Rfl:   No Known Allergies  History reviewed: allergies, current medications, past family history, past medical history, past social history, past surgical history and problem list  Chronic issues discussed: Compliant with medicaiton for BP and cholesterol.  Takes several supplements as well.   Acute issues discussed: none  Objective:    Biometrics BP 124/80   Pulse 68   Ht 5' 7.5 (1.715 m)   Wt 156 lb (70.8 kg)   SpO2 98%   BMI 24.07 kg/m   General: Well-developed well-nourished no acute distress Skin: No new worrisome changes HEENT: normocephalic, sclerae anicteric, TMs pearly, nares patent, no discharge or erythema, pharynx normal Oral cavity: MMM, no lesions Neck: supple, no lymphadenopathy, no thyromegaly, no masses, no bruits Heart: RRR, normal S1, S2, no murmurs Lungs: CTA bilaterally, no wheezes, rhonchi, or rales Abdomen: +bs, soft, non tender, non distended, no masses, no hepatomegaly, no splenomegaly Musculoskeletal: nontender, no swelling, no obvious deformity Extremities: no edema, no cyanosis, no clubbing, positive few spider veins Pulses: 2+ symmetric, upper and lower extremities, normal cap refill Neurological: alert, oriented x 3, CN2-12 intact, strength normal upper extremities and lower extremities, sensation normal throughout, DTRs 2+ throughout, no cerebellar signs, gait normal Psychiatric: normal affect, behavior normal, pleasant  Breast/GYN-deferred to gynecology  Assessment:   Encounter Diagnoses  Name Primary?   Encounter for health maintenance examination in adult  Yes   Essential hypertension, benign    Vitamin D  deficiency    Postmenopausal estrogen deficiency    Osteopenia, unspecified location    Hyperlipidemia, unspecified hyperlipidemia type    Chronic bilateral low back pain without sciatica    Advanced care planning/counseling discussion    Screening for hematuria or proteinuria    Screening for condition      Plan:   This visit was a preventative care visit, also known as wellness visit or routine physical.   Topics typically include healthy lifestyle, diet, exercise, preventative care, vaccinations, sick and well care, proper use of emergency dept and after hours care, as well as other concerns.     Recommendations: Continue to return yearly for your annual wellness and preventative care visits.  This gives us  a chance to discuss healthy lifestyle, exercise, vaccinations, review your chart record, and perform screenings where appropriate.  I recommend you see your eye doctor yearly for routine vision care.  I recommend you see your dentist yearly for routine dental care including hygiene visits twice yearly.   Vaccination recommendations were reviewed Immunization History  Administered Date(s) Administered   Fluad Quad(high Dose 65+) 12/05/2018, 12/15/2020   Influenza Split 10/31/2011   Influenza,inj,Quad PF,6+ Mos 11/06/2013, 10/27/2015, 11/13/2016, 11/23/2017   Influenza-Unspecified 11/28/2019, 01/05/2022   PFIZER(Purple Top)SARS-COV-2 Vaccination 03/28/2019, 04/22/2019, 12/01/2019, 09/01/2020   Pfizer Covid-19 Vaccine Bivalent Booster 5y-11y 02/01/2021   Pfizer(Comirnaty)Fall Seasonal Vaccine 12 years and older 12/26/2021   Pneumococcal Conjugate-13 01/09/2019   Pneumococcal Polysaccharide-23 04/19/2020   Rsv, Bivalent, Protein Subunit Rsvpref,pf Marlow) 02/25/2021   Tdap 03/01/2007, 08/08/2021   Zoster Recombinant(Shingrix) 04/18/2021, 07/04/2021   Zoster, Live 10/24/2012    Advised yearly flu shot   Screening for  cancer: Colon cancer screening: I reviewed your colonoscopy on file that is up to date from 2022, due repeat in 2027, Dr. Luis, but he has retired.   Breast cancer screening: You should perform a self breast exam monthly.   We reviewed recommendations for  regular mammograms and breast cancer screening.  Cervical cancer screening: We reviewed recommendations for pap smear screening.   Skin cancer screening: Check your skin regularly for new changes, growing lesions, or other lesions of concern Come in for evaluation if you have skin lesions of concern.  Lung cancer screening: If you have a greater than 20 pack year history of tobacco use, then you may qualify for lung cancer screening with a chest CT scan.   Please call your insurance company to inquire about coverage for this test.  We currently don't have screenings for other cancers besides breast, cervical, colon, and lung cancers.  If you have a strong family history of cancer or have other cancer screening concerns, please let me know.    Bone health: Get at least 150 minutes of aerobic exercise weekly Get weight bearing exercise at least once weekly Bone density test:  A bone density test is an imaging test that uses a type of X-ray to measure the amount of calcium  and other minerals in your bones. The test may be used to diagnose or screen you for a condition that causes weak or thin bones (osteoporosis), predict your risk for a broken bone (fracture), or determine how well your osteoporosis treatment is working. The bone density test is recommended for females 65 and older, or females or males <65 if certain risk factors such as thyroid  disease, long term use of steroids such as for asthma or rheumatological issues, vitamin D  deficiency, estrogen deficiency, family history of osteoporosis, self or family history of fragility fracture in first degree relative.  Bone density 04/2023 shows osteopenia.  Counseled on bone health,  calcium  and vitamin D  supplementation, exercise, prevention   Heart health: Get at least 150 minutes of aerobic exercise weekly Limit alcohol It is important to maintain a healthy blood pressure and healthy cholesterol numbers  Heart disease screening: Screening for heart disease includes screening for blood pressure, fasting lipids, glucose/diabetes screening, BMI height to weight ratio, reviewed of smoking status, physical activity, and diet.    Goals include blood pressure 120/80 or less, maintaining a healthy lipid/cholesterol profile, preventing diabetes or keeping diabetes numbers under good control, not smoking or using tobacco products, exercising most days per week or at least 150 minutes per week of exercise, and eating healthy variety of fruits and vegetables, healthy oils, and avoiding unhealthy food choices like fried food, fast food, high sugar and high cholesterol foods.    Other tests may possibly include EKG test, CT coronary calcium  score, echocardiogram, exercise treadmill stress test.    Medical care options: I recommend you continue to seek care here first for routine care.  We try really hard to have available appointments Monday through Friday daytime hours for sick visits, acute visits, and physicals.  Urgent care should be used for after hours and weekends for significant issues that cannot wait till the next day.  The emergency department should be used for significant potentially life-threatening emergencies.  The emergency department is expensive, can often have long wait times for less significant concerns, so try to utilize primary care, urgent care, or telemedicine when possible to avoid unnecessary trips to the emergency department.  Virtual visits and telemedicine have been introduced since the pandemic started in 2020, and can be convenient ways to receive medical care.  We offer virtual appointments as well to assist you in a variety of options to seek medical  care.   Advanced Directives: I recommend you consider completing a Health  Care Power of Attorney and Living Will.   These documents respect your wishes and help alleviate burdens on your loved ones if you were to become terminally ill or be in a position to need those documents enforced.    You can complete Advanced Directives yourself, have them notarized, then have copies made for our office, for you and for anybody you feel should have them in safe keeping.  Or, you can have an attorney prepare these documents.   If you haven't updated your Last Will and Testament in a while, it may be worthwhile having an attorney prepare these documents together and save on some costs.       Separate significant issues: Hypertension continue lisinopril  2.5 mg daily  Hyperlipidemia-continue Crestor  5 mg daily and over-the-counter fish oil    Sherri Rangel was seen today for annual exam.  Diagnoses and all orders for this visit:  Encounter for health maintenance examination in adult -     Measles/Mumps/Rubella Immunity; Future -     CBC; Future -     Comprehensive metabolic panel with GFR; Future -     Lipid panel; Future -     VITAMIN D  25 Hydroxy (Vit-D Deficiency, Fractures); Future -     Urinalysis, Routine w reflex microscopic; Future  Essential hypertension, benign  Vitamin D  deficiency -     VITAMIN D  25 Hydroxy (Vit-D Deficiency, Fractures); Future  Postmenopausal estrogen deficiency -     VITAMIN D  25 Hydroxy (Vit-D Deficiency, Fractures); Future  Osteopenia, unspecified location -     VITAMIN D  25 Hydroxy (Vit-D Deficiency, Fractures); Future  Hyperlipidemia, unspecified hyperlipidemia type -     Comprehensive metabolic panel with GFR; Future -     Lipid panel; Future  Chronic bilateral low back pain without sciatica  Advanced care planning/counseling discussion  Screening for hematuria or proteinuria -     Urinalysis, Routine w reflex microscopic; Future  Screening for  condition -     Measles/Mumps/Rubella Immunity; Future    F/u pending labs

## 2023-08-28 ENCOUNTER — Other Ambulatory Visit

## 2023-08-28 DIAGNOSIS — Z780 Asymptomatic menopausal state: Secondary | ICD-10-CM

## 2023-08-28 DIAGNOSIS — M8580 Other specified disorders of bone density and structure, unspecified site: Secondary | ICD-10-CM | POA: Diagnosis not present

## 2023-08-28 DIAGNOSIS — Z1389 Encounter for screening for other disorder: Secondary | ICD-10-CM | POA: Diagnosis not present

## 2023-08-28 DIAGNOSIS — Z0000 Encounter for general adult medical examination without abnormal findings: Secondary | ICD-10-CM | POA: Diagnosis not present

## 2023-08-28 DIAGNOSIS — Z139 Encounter for screening, unspecified: Secondary | ICD-10-CM

## 2023-08-28 DIAGNOSIS — E559 Vitamin D deficiency, unspecified: Secondary | ICD-10-CM | POA: Diagnosis not present

## 2023-08-28 DIAGNOSIS — E785 Hyperlipidemia, unspecified: Secondary | ICD-10-CM

## 2023-08-28 LAB — LIPID PANEL

## 2023-08-29 ENCOUNTER — Ambulatory Visit: Payer: Self-pay | Admitting: Medical

## 2023-08-29 ENCOUNTER — Other Ambulatory Visit: Payer: Self-pay | Admitting: Medical

## 2023-08-29 DIAGNOSIS — Z0184 Encounter for antibody response examination: Secondary | ICD-10-CM

## 2023-08-29 LAB — URINALYSIS, ROUTINE W REFLEX MICROSCOPIC
Specific Gravity, UA: 1.005 (ref 1.005–1.030)
Urobilinogen, Ur: 0.2 mg/dL (ref 0.2–1.0)
pH, UA: 6.5 (ref 5.0–7.5)

## 2023-08-29 LAB — COMPREHENSIVE METABOLIC PANEL WITH GFR
ALT: 15 IU/L (ref 0–32)
AST: 25 IU/L (ref 0–40)
Albumin: 4.5 g/dL (ref 3.9–4.9)
Alkaline Phosphatase: 87 IU/L (ref 44–121)
BUN/Creatinine Ratio: 17 (ref 12–28)
BUN: 12 mg/dL (ref 8–27)
Bilirubin Total: 0.5 mg/dL (ref 0.0–1.2)
CO2: 20 mmol/L (ref 20–29)
Calcium: 9.4 mg/dL (ref 8.7–10.3)
Chloride: 103 mmol/L (ref 96–106)
Creatinine, Ser: 0.72 mg/dL (ref 0.57–1.00)
Globulin, Total: 2.1 g/dL (ref 1.5–4.5)
Glucose: 90 mg/dL (ref 70–99)
Potassium: 4.4 mmol/L (ref 3.5–5.2)
Sodium: 139 mmol/L (ref 134–144)
Total Protein: 6.6 g/dL (ref 6.0–8.5)
eGFR: 90 mL/min/1.73 (ref >59)

## 2023-08-29 LAB — CBC
Hematocrit: 43.5 % (ref 34.0–46.6)
Hemoglobin: 13.4 g/dL (ref 11.1–15.9)
MCH: 28.8 pg (ref 26.6–33.0)
MCHC: 30.8 g/dL — ABNORMAL LOW (ref 31.5–35.7)
MCV: 94 fL (ref 79–97)
Platelets: 242 x10E3/uL (ref 150–450)
RBC: 4.65 x10E6/uL (ref 3.77–5.28)
RDW: 13 % (ref 11.7–15.4)
WBC: 4.9 x10E3/uL (ref 3.4–10.8)

## 2023-08-29 LAB — LIPID PANEL
Cholesterol, Total: 160 mg/dL (ref 100–199)
HDL: 59 mg/dL (ref >39)
LDL CALC COMMENT:: 2.7 ratio (ref 0.0–4.4)
LDL Chol Calc (NIH): 84 mg/dL (ref 0–99)
Triglycerides: 90 mg/dL (ref 0–149)
VLDL Cholesterol Cal: 17 mg/dL (ref 5–40)

## 2023-08-29 LAB — VITAMIN D 25 HYDROXY (VIT D DEFICIENCY, FRACTURES): Vit D, 25-Hydroxy: 59.9 ng/mL (ref 30.0–100.0)

## 2023-08-29 NOTE — Progress Notes (Signed)
 Results sent through MyChart

## 2023-09-25 NOTE — Telephone Encounter (Signed)
   Copied from CRM #[id number]. Topic: Clinical - Lab/Test Results >> Sep 25, 2023 10:05 AM Kevelyn M wrote: Reason for CRM: Patient is asking about Measles/Mumps/Rubella Immunity results. It states it needs to be collected. She is unsure of what she needs to for this. Please advise. # [id number].

## 2023-09-27 ENCOUNTER — Other Ambulatory Visit

## 2023-09-27 DIAGNOSIS — Z0184 Encounter for antibody response examination: Secondary | ICD-10-CM | POA: Diagnosis not present

## 2023-09-29 LAB — MEASLES/MUMPS/RUBELLA IMMUNITY
MUMPS ABS, IGG: >300 [AU]/ml (ref >10.9)
RUBEOLA AB, IGG: >300 [AU]/ml (ref >16.4)
Rubella Antibodies, IGG: >33 {index} (ref >0.99)

## 2023-10-01 ENCOUNTER — Ambulatory Visit: Payer: Self-pay | Admitting: Medical

## 2023-10-01 NOTE — Progress Notes (Signed)
 Results thru my chart

## 2023-11-23 DIAGNOSIS — L570 Actinic keratosis: Secondary | ICD-10-CM | POA: Diagnosis not present

## 2023-11-23 DIAGNOSIS — D225 Melanocytic nevi of trunk: Secondary | ICD-10-CM | POA: Diagnosis not present

## 2023-11-23 DIAGNOSIS — D2272 Melanocytic nevi of left lower limb, including hip: Secondary | ICD-10-CM | POA: Diagnosis not present

## 2023-11-23 DIAGNOSIS — X32XXXD Exposure to sunlight, subsequent encounter: Secondary | ICD-10-CM | POA: Diagnosis not present

## 2023-11-23 DIAGNOSIS — Z1283 Encounter for screening for malignant neoplasm of skin: Secondary | ICD-10-CM | POA: Diagnosis not present

## 2023-12-03 DIAGNOSIS — Z1231 Encounter for screening mammogram for malignant neoplasm of breast: Secondary | ICD-10-CM | POA: Diagnosis not present

## 2023-12-03 LAB — HM MAMMOGRAPHY

## 2023-12-04 ENCOUNTER — Encounter: Payer: Self-pay | Admitting: Family Medicine

## 2023-12-24 DIAGNOSIS — Z01419 Encounter for gynecological examination (general) (routine) without abnormal findings: Secondary | ICD-10-CM | POA: Diagnosis not present

## 2023-12-24 DIAGNOSIS — R350 Frequency of micturition: Secondary | ICD-10-CM | POA: Diagnosis not present

## 2023-12-24 DIAGNOSIS — N3281 Overactive bladder: Secondary | ICD-10-CM | POA: Diagnosis not present

## 2023-12-24 DIAGNOSIS — Z6824 Body mass index (BMI) 24.0-24.9, adult: Secondary | ICD-10-CM | POA: Diagnosis not present

## 2023-12-28 DIAGNOSIS — N181 Chronic kidney disease, stage 1: Secondary | ICD-10-CM | POA: Diagnosis not present

## 2023-12-28 DIAGNOSIS — M8580 Other specified disorders of bone density and structure, unspecified site: Secondary | ICD-10-CM | POA: Diagnosis not present

## 2023-12-28 DIAGNOSIS — E785 Hyperlipidemia, unspecified: Secondary | ICD-10-CM | POA: Diagnosis not present

## 2023-12-28 DIAGNOSIS — I129 Hypertensive chronic kidney disease with stage 1 through stage 4 chronic kidney disease, or unspecified chronic kidney disease: Secondary | ICD-10-CM | POA: Diagnosis not present

## 2023-12-28 DIAGNOSIS — R32 Unspecified urinary incontinence: Secondary | ICD-10-CM | POA: Diagnosis not present

## 2023-12-28 DIAGNOSIS — Z833 Family history of diabetes mellitus: Secondary | ICD-10-CM | POA: Diagnosis not present

## 2024-01-08 ENCOUNTER — Ambulatory Visit: Payer: Self-pay

## 2024-01-08 VITALS — Ht 67.5 in | Wt 150.0 lb

## 2024-01-08 DIAGNOSIS — Z0000 Encounter for general adult medical examination without abnormal findings: Secondary | ICD-10-CM | POA: Diagnosis not present

## 2024-01-08 NOTE — Progress Notes (Signed)
 Chief Complaint  Patient presents with   Medicare Wellness     Subjective:   Sherri Rangel is a 68 y.o. female who presents for a Medicare Annual Wellness Visit.  Allergies (verified) Patient has no known allergies.   History: Past Medical History:  Diagnosis Date   Anemia    prior to 2016   BCC (basal cell carcinoma)    right eye, LLE   Colon polyp 08/2009   WNL, internal hemorrhoid (Dr. Luis); also had colonoscopy 2008 with Dr. Avram   Encounter for routine gynecological examination    sees Dr. Curlee Guan   H/O bone density study    normal 2017, 2015   Hyperlipidemia    Hypertension    Iron deficiency 06/2009   negative EGD and colonoscopy 08/2009   Menopausal state    Squamous cell carcinoma, leg, right 2021   Vaginal atrophy    Past Surgical History:  Procedure Laterality Date   BREAST SURGERY     RIGHT BREAST BIOPSY/FCD   COLONOSCOPY  09/2014   Dr. Luis, prior 2011.   due back 2021, hx/o polyps   skin tags     Family History  Problem Relation Age of Onset   Cancer Mother 50       ovarian cancer   Hypertension Mother    Osteoporosis Mother    Cancer Father        esophageal   Alcohol abuse Father    Hyperlipidemia Father    Cancer Brother        MULTIPLE MYELOMA   Diabetes Maternal Aunt    Diabetes Cousin    Diabetes Cousin    Heart disease Paternal Grandfather    Social History   Occupational History   Occupation: warden/ranger at [employer]    Employer: [employer]  Tobacco Use   Smoking status: Never   Smokeless tobacco: Never  Vaping Use   Vaping status: Never Used  Substance and Sexual Activity   Alcohol use: Yes    Comment: 0-4 a week   Drug use: No   Sexual activity: Yes    Birth control/protection: Post-menopausal    Comment: <5, older than 16   Tobacco Counseling Counseling given: Not Answered  SDOH Screenings   Food Insecurity: Food Insecurity Present (01/01/2024)  Housing: Low Risk   (01/01/2024)  Transportation Needs: No Transportation Needs (01/01/2024)  Utilities: Not At Risk (01/08/2024)  Alcohol Screen: Low Risk  (01/01/2024)  Depression (PHQ2-9): Low Risk  (01/08/2024)  Financial Resource Strain: Low Risk  (01/01/2024)  Physical Activity: Sufficiently Active (01/01/2024)  Social Connections: Socially Integrated (01/01/2024)  Stress: No Stress Concern Present (01/01/2024)  Tobacco Use: Low Risk  (01/08/2024)  Health Literacy: Adequate Health Literacy (01/08/2024)   See flowsheets for full screening details  Depression Screen PHQ 2 & 9 Depression Scale- Over the past 2 weeks, how often have you been bothered by any of the following problems? Little interest or pleasure in doing things: 0 Feeling down, depressed, or hopeless (PHQ Adolescent also includes...irritable): 0 PHQ-2 Total Score: 0 Trouble falling or staying asleep, or sleeping too much: 0 Feeling tired or having little energy: 0 Poor appetite or overeating (PHQ Adolescent also includes...weight loss): 0 Feeling bad about yourself - or that you are a failure or have let yourself or your family down: 0 Trouble concentrating on things, such as reading the newspaper or watching television (PHQ Adolescent also includes...like school work): 0 Moving or speaking so slowly that other  people could have noticed. Or the opposite - being so fidgety or restless that you have been moving around a lot more than usual: 0 Thoughts that you would be better off dead, or of hurting yourself in some way: 0 PHQ-9 Total Score: 0 If you checked off any problems, how difficult have these problems made it for you to do your work, take care of things at home, or get along with other people?: Not difficult at all  Depression Treatment Depression Interventions/Treatment : EYV7-0 Score <4 Follow-up Not Indicated     Goals Addressed             This Visit's Progress    Patient Stated       01/08/2024, remain independent  and continue to do what she does       Visit info / Clinical Intake: Medicare Wellness Visit Type:: Subsequent Annual Wellness Visit Persons participating in visit:: patient Medicare Wellness Visit Mode:: Video Because this visit was a virtual/telehealth visit:: unable to obtan vitals due to lack of equipment If Telephone or Video please confirm:: I connected with the patient using audio enabled telemedicine application and verified that I am speaking with the correct person using two identifiers; I discussed the limitations of evaluation and management by telemedicine; The patient expressed understanding and agreed to proceed Patient Location:: home Provider Location:: office Information given by:: patient Interpreter Needed?: No Pre-visit prep was completed: yes AWV questionnaire completed by patient prior to visit?: yes Date:: 01/01/24 Living arrangements:: lives with spouse/significant other; with family/others Patient's Overall Health Status Rating: excellent Typical amount of pain: none Does pain affect daily life?: no Are you currently prescribed opioids?: no  Dietary Habits and Nutritional Risks How many meals a day?: 3 Eats fruit and vegetables daily?: yes Most meals are obtained by: preparing own meals; eating out In the last 2 weeks, have you had any of the following?: none Diabetic:: no  Functional Status Activities of Daily Living (to include ambulation/medication): Independent Ambulation: Independent Medication Administration: Independent Home Management: Independent Primary transportation is: driving Concerns about vision?: no *vision screening is required for WTM* Concerns about hearing?: no  Fall Screening Falls in the past year?: 0 Number of falls in past year: 0 Was there an injury with Fall?: 0 Fall Risk Category Calculator: 0 Patient Fall Risk Level: Low Fall Risk  Fall Risk Patient at Risk for Falls Due to: Medication side effect Fall risk Follow  up: Falls prevention discussed; Falls evaluation completed  Home and Transportation Safety: All rugs have non-skid backing?: yes All stairs or steps have railings?: yes Grab bars in the bathtub or shower?: (!) no Have non-skid surface in bathtub or shower?: (!) no Good home lighting?: yes Regular seat belt use?: yes Hospital stays in the last year:: no  Cognitive Assessment Difficulty concentrating, remembering, or making decisions? : no Will 6CIT or Mini Cog be Completed: yes What year is it?: 0 points What month is it?: 0 points Give patient an address phrase to remember (5 components): [address] About what time is it?: 0 points Count backwards from 20 to 1: 0 points Say the months of the year in reverse: 0 points Repeat the address phrase from earlier: 2 points 6 CIT Score: 2 points  Advance Directives (For Healthcare) Does Patient Have a Medical Advance Directive?: No Would patient like information on creating a medical advance directive?: No - Patient declined  Reviewed/Updated  Reviewed/Updated: Reviewed All (Medical, Surgical, Family, Medications, Allergies, Care  Teams, Patient Goals)        Objective:    Today's Vitals   01/08/24 0801  Weight: 150 lb (68 kg)  Height: 5' 7.5 (1.715 m)   Body mass index is 23.15 kg/m.  Current Medications (verified) Outpatient Encounter Medications as of 01/08/2024  Medication Sig   Calcium -Magnesium-Vitamin D  (CALCIUM  1200+D3 PO)    Cholecalciferol (VITAMIN D ) 50 MCG (2000 UT) CAPS    Coenzyme Q10 (CO Q 10 PO) Take by mouth.   cycloSPORINE (RESTASIS) 0.05 % ophthalmic emulsion    Glucosamine-Chondroit-Vit C-Mn (GLUCOSAMINE 1500 COMPLEX PO)    lisinopril  (ZESTRIL ) 2.5 MG tablet Take 1 tablet (2.5 mg total) by mouth daily.   Multiple Vitamin (MULTIVITAMIN) tablet Take 1 tablet by mouth daily.   RA KRILL OIL PO Take 750 mg by mouth daily.   rosuvastatin  (CRESTOR ) 5 MG tablet Take 1 tablet (5 mg total) by mouth at  bedtime.   tretinoin (RETIN-A) 0.025 % cream Apply topically at bedtime.   Turmeric 500 MG CAPS Take by mouth.   Zinc Gluconate 50 MG CAPS Take 1 capsule by mouth daily.   No facility-administered encounter medications on file as of 01/08/2024.   Hearing/Vision screen Hearing Screening - Comments:: Denies hearing issues Vision Screening - Comments:: Regular eye exams. [facility] Immunizations and Health Maintenance Health Maintenance  Topic Date Due   COVID-19 Vaccine (9 - Pfizer risk 2025-26 season) 06/16/2024   Medicare Annual Wellness (AWV)  01/07/2025   Mammogram  12/02/2025   Colonoscopy  02/23/2030   DTaP/Tdap/Td (3 - Td or Tdap) 08/09/2031   Pneumococcal Vaccine: 50+ Years  Completed   Influenza Vaccine  Completed   DEXA SCAN  Completed   Hepatitis C Screening  Completed   Zoster Vaccines- Shingrix  Completed   Meningococcal B Vaccine  Aged Out        Assessment/Plan:  This is a routine wellness examination for [facility].  Patient Care Team: Tysinger, Alm RAMAN, PA-C as PCP - General (Family Medicine) Dannielle Bouchard, DO as Consulting Physician (Obstetrics and Gynecology) Shona Rush, MD (Dermatology) Kennyth Cy RAMAN, DO (Osteopathic Medicine)  I have personally reviewed and noted the following in the patient's chart:   Medical and social history Use of alcohol, tobacco or illicit drugs  Current medications and supplements including opioid prescriptions. Functional ability and status Nutritional status Physical activity Advanced directives List of other physicians Hospitalizations, surgeries, and ER visits in previous 12 months Vitals Screenings to include cognitive, depression, and falls Referrals and appointments  No orders of the defined types were placed in this encounter.  In addition, I have reviewed and discussed with patient certain preventive protocols, quality metrics, and best practice recommendations. A written personalized care plan for preventive  services as well as general preventive health recommendations were provided to patient.   Sherri FORBES Dawn, LPN   [id number]   Return in 1 year (on 01/07/2025).  After Visit Summary: (MyChart) Due to this being a telephonic visit, the after visit summary with patients personalized plan was offered to patient via MyChart   Nurse Notes: none

## 2024-01-08 NOTE — Patient Instructions (Signed)
 Sherri Rangel,  Thank you for taking the time for your Medicare Wellness Visit. I appreciate your continued commitment to your health goals. Please review the care plan we discussed, and feel free to reach out if I can assist you further.  Please note that Annual Wellness Visits do not include a physical exam. Some assessments may be limited, especially if the visit was conducted virtually. If needed, we may recommend an in-person follow-up with your provider.  Ongoing Care Seeing your primary care provider every 3 to 6 months helps us  monitor your health and provide consistent, personalized care.   Referrals If a referral was made during today's visit and you haven't received any updates within two weeks, please contact the referred provider directly to check on the status.  Recommended Screenings:  Health Maintenance  Topic Date Due   Medicare Annual Wellness Visit  08/08/2023   COVID-19 Vaccine (9 - Pfizer risk 2025-26 season) 06/16/2024   Breast Cancer Screening  12/02/2025   Colon Cancer Screening  02/23/2030   DTaP/Tdap/Td vaccine (3 - Td or Tdap) 08/09/2031   Pneumococcal Vaccine for age over 32  Completed   Flu Shot  Completed   DEXA scan (bone density measurement)  Completed   Hepatitis C Screening  Completed   Zoster (Shingles) Vaccine  Completed   Meningitis B Vaccine  Aged Out       01/08/2024    8:07 AM  Advanced Directives  Does Patient Have a Medical Advance Directive? No  Would patient like information on creating a medical advance directive? No - Patient declined    Vision: Annual vision screenings are recommended for early detection of glaucoma, cataracts, and diabetic retinopathy. These exams can also reveal signs of chronic conditions such as diabetes and high blood pressure.  Dental: Annual dental screenings help detect early signs of oral cancer, gum disease, and other conditions linked to overall health, including heart disease and diabetes.  Please see  the attached documents for additional preventive care recommendations.

## 2024-08-27 ENCOUNTER — Ambulatory Visit: Payer: Self-pay | Admitting: Medical

## 2025-01-13 ENCOUNTER — Ambulatory Visit
